# Patient Record
Sex: Female | Born: 2000 | Race: Black or African American | Hispanic: No | Marital: Married | Smoking: Never smoker
Health system: Southern US, Community
[De-identification: ages and names within clinical notes are randomized; demographics above are authoritative.]

## PROBLEM LIST (undated history)

## (undated) DIAGNOSIS — Z789 Other specified health status: Secondary | ICD-10-CM

## (undated) HISTORY — DX: Other specified health status: Z78.9

---
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
## 2021-04-06 ENCOUNTER — Ambulatory Visit (INDEPENDENT_AMBULATORY_CARE_PROVIDER_SITE_OTHER): Payer: Medicaid Other | Admitting: Pediatrics

## 2021-04-06 ENCOUNTER — Other Ambulatory Visit (HOSPITAL_COMMUNITY)
Admission: RE | Admit: 2021-04-06 | Discharge: 2021-04-06 | Disposition: A | Discharge status: Home / Self Care | Payer: Medicaid Other | Source: Ambulatory Visit | Attending: Pediatrics | Admitting: Pediatrics

## 2021-04-06 ENCOUNTER — Other Ambulatory Visit: Payer: Self-pay

## 2021-04-06 ENCOUNTER — Encounter: Payer: Self-pay | Admitting: Pediatrics

## 2021-04-06 VITALS — BP 133/81 | HR 92 | Ht 64.0 in | Wt 334.0 lb

## 2021-04-06 DIAGNOSIS — Z113 Encounter for screening for infections with a predominantly sexual mode of transmission: Secondary | ICD-10-CM | POA: Diagnosis present

## 2021-04-06 DIAGNOSIS — Z975 Presence of (intrauterine) contraceptive device: Secondary | ICD-10-CM | POA: Insufficient documentation

## 2021-04-06 DIAGNOSIS — Z114 Encounter for screening for human immunodeficiency virus [HIV]: Secondary | ICD-10-CM | POA: Diagnosis not present

## 2021-04-06 DIAGNOSIS — N926 Irregular menstruation, unspecified: Secondary | ICD-10-CM | POA: Diagnosis not present

## 2021-04-06 DIAGNOSIS — Z3202 Encounter for pregnancy test, result negative: Secondary | ICD-10-CM

## 2021-04-06 DIAGNOSIS — Z7251 High risk heterosexual behavior: Secondary | ICD-10-CM | POA: Diagnosis not present

## 2021-04-06 DIAGNOSIS — Z30017 Encounter for initial prescription of implantable subdermal contraceptive: Secondary | ICD-10-CM

## 2021-04-06 LAB — POCT RAPID HIV: Rapid HIV, POC: NEGATIVE

## 2021-04-06 LAB — POCT URINE PREGNANCY: Preg Test, Ur: NEGATIVE

## 2021-04-06 MED ORDER — ETONOGESTREL 68 MG ~~LOC~~ IMPL
68.0000 mg | DRUG_IMPLANT | Freq: Once | SUBCUTANEOUS | Status: AC
  Administered 2021-04-06: 68 mg via SUBCUTANEOUS

## 2021-04-06 MED ORDER — LEVONORGESTREL 1.5 MG PO TABS
1.5000 mg | ORAL_TABLET | Freq: Once | ORAL | Status: AC
  Administered 2021-04-06: 1.5 mg via ORAL

## 2021-04-06 NOTE — Patient Instructions (Signed)
° °  Congratulations on getting your Nexplanon placement!  Below is some important information about Nexplanon. ° °First remember that Nexplanon does not prevent sexually transmitted infections.  Condoms will help prevent sexually transmitted infections. °The Nexplanon starts working 7 days after it was inserted.  There is a risk of getting pregnant if you have unprotected sex in those first 7 days after placement of the Nexplanon. ° °The Nexplanon lasts for 3 years but can be removed at any time.  You can become pregnant as early as 1 week after removal.  You can have a new Nexplanon put in after the old one is removed if you like. ° °It is not known whether Nexplanon is as effective in women who are very overweight because the studies did not include many overweight women. ° °Nexplanon interacts with some medications, including barbiturates, bosentan, carbamazepine, felbamate, griseofulvin, oxcarbazepine, phenytoin, rifampin, St. John's wort, topiramate, HIV medicines.  Please alert your doctor if you are on any of these medicines. ° °Always tell other healthcare providers that you have a Nexplanon in your arm. ° °The Nexplanon was placed just under the skin.  Leave the outside bandage on for 24 hours.  Leave the smaller bandage on for 3-5 days or until it falls off on its own.  Keep the area clean and dry for 3-5 days. °There is usually bruising or swelling at the insertion site for a few days to a week after placement.  If you see redness or pus draining from the insertion site, call us immediately. ° °Keep your user card with the date the implant was placed and the date the implant is to be removed. ° °The most common side effect is a change in your menstrual bleeding pattern.   This bleeding is generally not harmful to you but can be annoying.  Call or come in to see us if you have any concerns about the bleeding or if you have any side effects or questions.   ° °We will call you in 1 week to check in and we  would like you to return to the clinic for a follow-up visit in 1 month. ° °You can call [facility] 24 hours a day with any questions or concerns.  There is always a nurse or doctor available to take your call.  Call 9-1-1 if you have a life-threatening emergency.  For anything else, please call us at [phone] before heading to the ER. °

## 2021-04-06 NOTE — Progress Notes (Signed)
[phone] confidential number

## 2021-04-06 NOTE — Progress Notes (Signed)
THIS RECORD MAY CONTAIN CONFIDENTIAL INFORMATION THAT SHOULD NOT BE RELEASED WITHOUT REVIEW OF THE SERVICE PROVIDER.  Adolescent Medicine Consultation Initial Visit Krista Carroll  is a 20 y.o. female referred by Pediatricians, Rolland Bimler* here today for evaluation of contraception options.    Supervising Physician: Dr. Delorse Lek    Review of records?  yes  Pertinent Labs? No  Growth Chart Viewed? yes   History was provided by the patient and mother.    Chief complaint: wants nexplanon  HPI:   PCP Confirmed?  yes   Referred by: Dr. Abran Cantor  At [employer] studying radiology. Wants to go to [employer]. Working on getting a job. Was livng in [facility] 7 months.   Menarche at 43. They have never been regular. Sometimes they were for a few years. When she started OCP her periods stopped. After stopping OCP in May she has had one period since then. Lasted about 4-5 days. Some cramping but not awful. Has never missed period more than 3 months but has had more than one ina month. Has had one cycle that lasted about 3-4 weeks. Occasional headaches. LMP she can't remember.   Mom with irregular cycles when she was in a larger body. No infertility. Mom used depo which didn't work well- gained a lot of weight and still got pregnant. Mom with HTN. No DM history.   Lives at home with mom and sister. Sister is 63- possibly not having regular cycles.   Unprotected sex 2 days ago- agreeable to [facility] here today. With the same female partner. Feels safe in relationship. No concerns today.   No LMP recorded.  No Known Allergies Current Outpatient Medications on File Prior to Visit  Medication Sig Dispense Refill   etonogestrel (NEXPLANON) 68 MG IMPL implant 1 each (68 mg total) by Subdermal route once. 1 each 0   cyclobenzaprine (FLEXERIL) 5 MG tablet Take 1-2 tablets (5-10 mg total) by mouth 2 (two) times daily as needed for muscle spasms. (Patient not taking: Reported on 04/06/2021) 24 tablet 0   naproxen  (NAPROSYN) 500 MG tablet Take 1 tablet (500 mg total) by mouth 2 (two) times daily. (Patient not taking: Reported on 04/06/2021) 30 tablet 0   No current facility-administered medications on file prior to visit.    Patient Active Problem List   Diagnosis Date Noted   Irregular menses 04/06/2021    Past Medical History:  Reviewed and updated?  yes Past Medical History:  Diagnosis Date   Medical history non-contributory     Family History: Reviewed and updated? yes Family History  Problem Relation Age of Onset   Obesity Mother    Hypertension Mother    Menstrual problems Mother    Menstrual problems Sister    Obesity Sister     Social History:  Activities:  Special interests/hobbies/sports: likes to write and go hiking, swimming, painting   Lifestyle habits that can impact QOL: Sleep:pretty good Eating habits/patterns: about 2 meals a day  Water intake: could do better  Exercise: hiking   Confidentiality was discussed with the patient and if applicable, with caregiver as well.  Gender identity: female Sex assigned at birth: female Pronouns: she Tobacco?  no Partner preference?  female  Sexually Active?  yes  Pregnancy Prevention:  condoms Reviewed condoms:  yes Reviewed EC:  yes    Trusted adult at home/school:  yes Feels safe at home:  yes Trusted friends:  yes Feels safe at school:  yes  The following portions of the  patient's history were reviewed and updated as appropriate: allergies, current medications, past family history, past medical history, past social history, past surgical history, and problem list.  Physical Exam:  Vitals:   04/06/21 1412  BP: 133/81  Pulse: 92  Weight: (!) 334 lb (151.5 kg)  Height: 5\' 4"  (1.626 m)   BP 133/81   Pulse 92   Ht 5\' 4"  (1.626 m)   Wt (!) 334 lb (151.5 kg)   BMI 57.33 kg/m  Body mass index: body mass index is 57.33 kg/m. Blood pressure percentiles are not available for patients who are 18 years or  older.   Physical Exam Vitals and nursing note reviewed.  Constitutional:      General: She is not in acute distress.    Appearance: She is well-developed. She is obese.  Neck:     Thyroid: No thyromegaly.  Cardiovascular:     Rate and Rhythm: Normal rate and regular rhythm.     Heart sounds: No murmur heard. Pulmonary:     Breath sounds: Normal breath sounds.  Abdominal:     Palpations: Abdomen is soft. There is no mass.     Tenderness: There is no abdominal tenderness. There is no guarding.  Musculoskeletal:     Right lower leg: No edema.     Left lower leg: No edema.  Lymphadenopathy:     Cervical: No cervical adenopathy.  Skin:    General: Skin is warm.     Capillary Refill: Capillary refill takes less than 2 seconds.     Findings: No rash.     Comments: Acanthosis  Neurological:     Mental Status: She is alert.     Comments: No tremor     Assessment/Plan: 1. Insertion of Nexplanon Pt desires nexplanon. Has been on OCP in the past but didn't like it. Discussed r/b/se and expectations. Has a friend who has had a good experience. Placed without difficulty today.  - etonogestrel (NEXPLANON) 68 MG IMPL implant; 1 each (68 mg total) by Subdermal route once.  Dispense: 1 each; Refill: 0 - etonogestrel (NEXPLANON) implant 68 mg - Subdermal Etonogestrel Implant Insertion  2. Irregular menses Irregular menstrual cycles. Urine preg negative today. No other overt signs of PCOS, however, will get labs today since we are starting hormones. She is in agreement.  - CBC - Comprehensive metabolic panel - DHEA-sulfate - Follicle stimulating hormone - Luteinizing hormone - Prolactin - Testos,Total,Free and SHBG (Female) - CBC with Differential/Platelet - Lipid panel - Hemoglobin A1c - TSH - T4, free  3. Unprotected sex Plan B today- discussed nexplanon not effective for 7 days. Will repeat urine preg here in 1 month.  - levonorgestrel (PLAN B 1-STEP) tablet 1.5 mg  4.  Routine screening for STI (sexually transmitted infection) Per protocol.  - POCT Rapid HIV - Urine cytology ancillary only  5. Pregnancy examination or test, negative result Neg despite missed period.  - POCT urine pregnancy   Follow-up:   Return in about 4 weeks (around 05/04/2021).   Medical decision-making:  >40 minutes spent face to face with patient with more than 50% of appointment spent discussing diagnosis, management, follow-up, and reviewing of contraception, irreugular menses.  A copy of this consultation visit was sent to: Pediatricians, [place], Pediatricians, *

## 2021-04-06 NOTE — Procedures (Signed)
Nexplanon Insertion  No contraindications for placement.  No liver disease, no unexplained vaginal bleeding, no h/o breast cancer, no h/o blood clots.  No LMP recorded.  UHCG: neg  Last Unprotected sex:  two days ago- plan b given  Risks & benefits of Nexplanon discussed The nexplanon device was purchased and supplied by [facility]. Packaging instructions supplied to patient Consent form signed  The patient denies any allergies to anesthetics or antiseptics.  Procedure: Pt was placed in supine position. The left arm was flexed at the elbow and externally rotated so that her wrist was parallel to her ear The medial epicondyle of the left arm was identified The insertions site was marked 8 cm proximal to the medial epicondyle The insertion site was cleaned with Betadine The area surrounding the insertion site was covered with a sterile drape 1% lidocaine was injected just under the skin at the insertion site extending 4 cm proximally. The sterile preloaded disposable Nexaplanon applicator was removed from the sterile packaging The applicator needle was inserted at a 30 degree angle at 8 cm proximal to the medial epicondyle as marked The applicator was lowered to a horizontal position and advanced just under the skin for the full length of the needle The slider on the applicator was retracted fully while the applicator remained in the same position, then the applicator was removed. The implant was confirmed via palpation as being in position The implant position was demonstrated to the patient Pressure dressing was applied to the patient.  The patient was instructed to removed the pressure dressing in 24 hrs.  The patient was advised to move slowly from a supine to an upright position  The patient denied any concerns or complaints  The patient was instructed to schedule a follow-up appt in 1 month and to call sooner if any concerns.  The patient acknowledged agreement and  understanding of the plan.

## 2021-04-07 LAB — URINE CYTOLOGY ANCILLARY ONLY
Chlamydia: NEGATIVE
Comment: NEGATIVE
Comment: NORMAL
Neisseria Gonorrhea: NEGATIVE

## 2021-04-10 LAB — CBC WITH DIFFERENTIAL/PLATELET
Absolute Monocytes: 570 cells/uL (ref 200–900)
Basophils Absolute: 37 cells/uL (ref 0–200)
Basophils Relative: 0.3 %
Eosinophils Absolute: 211 cells/uL (ref 15–500)
Eosinophils Relative: 1.7 %
HCT: 37.4 % (ref 34.0–46.0)
Hemoglobin: 12 g/dL (ref 11.5–15.3)
Lymphs Abs: 2889 cells/uL (ref 1200–5200)
MCH: 25.6 pg (ref 25.0–35.0)
MCHC: 32.1 g/dL (ref 31.0–36.0)
MCV: 79.7 fL (ref 78.0–98.0)
MPV: 9.9 fL (ref 7.5–12.5)
Monocytes Relative: 4.6 %
Neutro Abs: 8692 cells/uL — ABNORMAL HIGH (ref 1800–8000)
Neutrophils Relative %: 70.1 %
Platelets: 448 10*3/uL — ABNORMAL HIGH (ref 140–400)
RBC: 4.69 10*6/uL (ref 3.80–5.10)
RDW: 14.8 % (ref 11.0–15.0)
Total Lymphocyte: 23.3 %
WBC: 12.4 10*3/uL (ref 4.5–13.0)

## 2021-04-10 LAB — LIPID PANEL
Cholesterol: 167 mg/dL (ref <170)
HDL: 44 mg/dL — ABNORMAL LOW (ref >45)
LDL Cholesterol (Calc): 100 mg/dL (calc) (ref <110)
Non-HDL Cholesterol (Calc): 123 mg/dL (calc) — ABNORMAL HIGH (ref <120)
Total CHOL/HDL Ratio: 3.8 (calc) (ref <5.0)
Triglycerides: 131 mg/dL — ABNORMAL HIGH (ref <90)

## 2021-04-10 LAB — HEMOGLOBIN A1C
Hgb A1c MFr Bld: 5.9 % of total Hgb — ABNORMAL HIGH (ref <5.7)
Mean Plasma Glucose: 123 mg/dL
eAG (mmol/L): 6.8 mmol/L

## 2021-04-10 LAB — COMPREHENSIVE METABOLIC PANEL
AG Ratio: 1.3 (calc) (ref 1.0–2.5)
ALT: 18 U/L (ref 5–32)
AST: 12 U/L (ref 12–32)
Albumin: 4 g/dL (ref 3.6–5.1)
Alkaline phosphatase (APISO): 71 U/L (ref 36–128)
BUN: 9 mg/dL (ref 7–20)
CO2: 26 mmol/L (ref 20–32)
Calcium: 9.5 mg/dL (ref 8.9–10.4)
Chloride: 102 mmol/L (ref 98–110)
Creat: 0.63 mg/dL (ref 0.50–0.96)
Globulin: 3.2 g/dL (calc) (ref 2.0–3.8)
Glucose, Bld: 89 mg/dL (ref 65–99)
Potassium: 4.3 mmol/L (ref 3.8–5.1)
Sodium: 136 mmol/L (ref 135–146)
Total Bilirubin: 0.3 mg/dL (ref 0.2–1.1)
Total Protein: 7.2 g/dL (ref 6.3–8.2)

## 2021-04-10 LAB — TESTOS,TOTAL,FREE AND SHBG (FEMALE)
Free Testosterone: 5.7 pg/mL (ref 0.1–6.4)
Sex Hormone Binding: 35 nmol/L (ref 17–124)
Testosterone, Total, LC-MS-MS: 43 ng/dL (ref 2–45)

## 2021-04-10 LAB — T4, FREE: Free T4: 1.1 ng/dL (ref 0.8–1.4)

## 2021-04-10 LAB — LUTEINIZING HORMONE: LH: 4.4 m[IU]/mL

## 2021-04-10 LAB — TSH: TSH: 2.97 mIU/L

## 2021-04-10 LAB — FOLLICLE STIMULATING HORMONE: FSH: 1.8 m[IU]/mL

## 2021-04-10 LAB — PROLACTIN: Prolactin: 20.1 ng/mL — ABNORMAL HIGH

## 2021-04-10 LAB — DHEA-SULFATE: DHEA-SO4: 321 ug/dL — ABNORMAL HIGH (ref 44–286)

## 2021-04-13 ENCOUNTER — Telehealth: Payer: Self-pay

## 2021-04-13 ENCOUNTER — Other Ambulatory Visit: Payer: Self-pay | Admitting: Pediatrics

## 2021-04-13 DIAGNOSIS — R7303 Prediabetes: Secondary | ICD-10-CM

## 2021-04-13 DIAGNOSIS — E282 Polycystic ovarian syndrome: Secondary | ICD-10-CM

## 2021-04-13 NOTE — Telephone Encounter (Signed)
Pt would like a cal back with lab results

## 2021-04-20 ENCOUNTER — Other Ambulatory Visit: Payer: Self-pay | Admitting: Pediatrics

## 2021-04-20 MED ORDER — METFORMIN HCL ER 500 MG PO TB24: ORAL_TABLET | ORAL | 3 refills | Status: DC

## 2021-04-27 ENCOUNTER — Ambulatory Visit: Payer: Medicaid Other

## 2021-04-28 ENCOUNTER — Ambulatory Visit: Payer: Medicaid Other | Admitting: Pediatrics

## 2021-04-29 ENCOUNTER — Encounter: Payer: No Typology Code available for payment source | Attending: Pediatrics | Admitting: Registered"

## 2021-04-30 ENCOUNTER — Ambulatory Visit: Payer: Medicaid Other | Admitting: Pediatrics

## 2021-05-07 ENCOUNTER — Encounter: Payer: Self-pay | Admitting: Physician Assistant

## 2021-05-07 ENCOUNTER — Telehealth: Payer: Medicaid Other

## 2021-05-20 ENCOUNTER — Telehealth: Payer: Medicaid Other | Admitting: Physician Assistant

## 2021-05-20 ENCOUNTER — Ambulatory Visit: Payer: Medicaid Other | Admitting: Registered"

## 2021-05-20 DIAGNOSIS — N939 Abnormal uterine and vaginal bleeding, unspecified: Secondary | ICD-10-CM

## 2021-05-20 NOTE — Progress Notes (Signed)
Virtual Visit Consent   Krista Carroll, you are scheduled for a virtual visit with a [facility] provider today.     Just as with appointments in the office, your consent must be obtained to participate.  Your consent will be active for this visit and any virtual visit you may have with one of our providers in the next 365 days.     If you have a MyChart account, a copy of this consent can be sent to you electronically.  All virtual visits are billed to your insurance company just like a traditional visit in the office.    As this is a virtual visit, video technology does not allow for your provider to perform a traditional examination.  This may limit your provider's ability to fully assess your condition.  If your provider identifies any concerns that need to be evaluated in person or the need to arrange testing (such as labs, EKG, etc.), we will make arrangements to do so.     Although advances in technology are sophisticated, we cannot ensure that it will always work on either your end or our end.  If the connection with a video visit is poor, the visit may have to be switched to a telephone visit.  With either a video or telephone visit, we are not always able to ensure that we have a secure connection.     I need to obtain your verbal consent now.   Are you willing to proceed with your visit today?    Krista Carroll has provided verbal consent on 05/20/2021 for a virtual visit (video or telephone).   Piedad Climes, [place]   Date: 05/20/2021 2:01 PM   Virtual Visit via Video Note   I, Piedad Climes, connected with  Krista Carroll  ([id number], 08-26-2001) on 05/20/21 at  1:45 PM EDT by a video-enabled telemedicine application and verified that I am speaking with the correct person using two identifiers.  Location: Patient: Virtual Visit Location Patient: Home Provider: Virtual Visit Location Provider: Home Office   I discussed the limitations of evaluation and management by  telemedicine and the availability of in person appointments. The patient expressed understanding and agreed to proceed.    History of Present Illness: Krista Carroll is a 20 y.o. who identifies as a female who was assigned female at birth, and is being seen today for questions about her menstrual period. Has history of irregular periods and some heavier flow on occasion. Has been followed by her PCP regarding this and was put on Metformin for suspected PCOS (she is no longer taking) and Nexplanon placed to help with periods and for contraception. This was placed in July. She notes before then she had not had a period since April. She did have a period early august lasting for a few days and seemed like a "normal" period for her. Notes over the past 1.5 days having some mild vaginal spotting but that is darker in color that what she is used to -- slightly more brown. Denies fever, chills, malaise or fatigue. Does occasionally get pelvic cramping but none recently. Denies any change to bowel or bladder habits. Is sexually active with one monogamous female partner. No protection. Is not concerned for STI.  HPI: HPI  Problems:  Patient Active Problem List   Diagnosis Date Noted   Irregular menses 04/06/2021   Nexplanon in place 04/06/2021    Allergies: No Known Allergies Medications:  Current Outpatient Medications:    etonogestrel (NEXPLANON) 68  MG IMPL implant, 1 each (68 mg total) by Subdermal route once., Disp: 1 each, Rfl: 0   metFORMIN (GLUCOPHAGE XR) 500 MG 24 hr tablet, Take 1 tablet (500 mg total) by mouth daily with breakfast for 7 days, THEN 2 tablets (1,000 mg total) daily with breakfast for 7 days, THEN 3 tablets (1,500 mg total) daily with breakfast for 14 days., Disp: 90 tablet, Rfl: 3  Observations/Objective: Patient is well-developed, well-nourished in no acute distress.  Resting comfortably at home.  Head is normocephalic, atraumatic.  No labored breathing. Speech is clear and  coherent with logical content.  Patient is alert and oriented at baseline.   Assessment and Plan: 1. Vaginal spotting <48 hours. Mild but slightly different in coloration. Discussed this could be related to hormonal changes from Nexplanon, etc. No other accompanying symptoms. Reassurance given. She is to monitor for onset of her full period. If this is not occurring and resolving in her usual time frame or if she just continues to spot without stopping, she needs to follow-up with her primary care provider. Giving recent placement of Nexplanon, sexual activity and her concerns, recommended a home pregnancy test to ease her mind.   Follow Up Instructions: I discussed the assessment and treatment plan with the patient. The patient was provided an opportunity to ask questions and all were answered. The patient agreed with the plan and demonstrated an understanding of the instructions.  A copy of instructions were sent to the patient via MyChart.  The patient was advised to call back or seek an in-person evaluation if the symptoms worsen or if the condition fails to improve as anticipated.  Time:  I spent 12 minutes with the patient via telehealth technology discussing the above problems/concerns.    Piedad Climes, PA-C

## 2021-05-26 ENCOUNTER — Ambulatory Visit (INDEPENDENT_AMBULATORY_CARE_PROVIDER_SITE_OTHER): Payer: Medicaid Other | Admitting: Family

## 2021-05-26 ENCOUNTER — Telehealth: Payer: Medicaid Other | Admitting: Physician Assistant

## 2021-05-26 ENCOUNTER — Encounter: Payer: Self-pay | Admitting: Family

## 2021-05-26 ENCOUNTER — Other Ambulatory Visit: Payer: Self-pay

## 2021-05-26 VITALS — BP 144/89 | HR 88 | Ht 64.0 in | Wt 330.2 lb

## 2021-05-26 DIAGNOSIS — N926 Irregular menstruation, unspecified: Secondary | ICD-10-CM | POA: Diagnosis not present

## 2021-05-26 DIAGNOSIS — E282 Polycystic ovarian syndrome: Secondary | ICD-10-CM

## 2021-05-26 DIAGNOSIS — N921 Excessive and frequent menstruation with irregular cycle: Secondary | ICD-10-CM

## 2021-05-26 DIAGNOSIS — Z113 Encounter for screening for infections with a predominantly sexual mode of transmission: Secondary | ICD-10-CM

## 2021-05-26 DIAGNOSIS — R102 Pelvic and perineal pain: Secondary | ICD-10-CM | POA: Diagnosis not present

## 2021-05-26 DIAGNOSIS — Z975 Presence of (intrauterine) contraceptive device: Secondary | ICD-10-CM

## 2021-05-26 LAB — POCT URINALYSIS DIPSTICK
Bilirubin, UA: NEGATIVE
Blood, UA: POSITIVE
Glucose, UA: NEGATIVE
Ketones, UA: NEGATIVE
Leukocytes, UA: NEGATIVE
Nitrite, UA: NEGATIVE
Protein, UA: POSITIVE — AB
Spec Grav, UA: 1.015 (ref 1.010–1.025)
Urobilinogen, UA: NEGATIVE E.U./dL — AB
pH, UA: 6.5 (ref 5.0–8.0)

## 2021-05-26 NOTE — Patient Instructions (Signed)
  Krista Carroll, thank you for joining Piedad Climes, PA-C for today's virtual visit.  While this provider is not your primary care provider (PCP), if your PCP is located in our provider database this encounter information will be shared with them immediately following your visit.  Consent: (Patient) Krista Carroll provided verbal consent for this virtual visit at the beginning of the encounter.  Current Medications:  Current Outpatient Medications:    etonogestrel (NEXPLANON) 68 MG IMPL implant, 1 each (68 mg total) by Subdermal route once., Disp: 1 each, Rfl: 0   metFORMIN (GLUCOPHAGE XR) 500 MG 24 hr tablet, Take 1 tablet (500 mg total) by mouth daily with breakfast for 7 days, THEN 2 tablets (1,000 mg total) daily with breakfast for 7 days, THEN 3 tablets (1,500 mg total) daily with breakfast for 14 days., Disp: 90 tablet, Rfl: 3   Medications ordered in this encounter:  No orders of the defined types were placed in this encounter.    *If you need refills on other medications prior to your next appointment, please contact your pharmacy*  Follow-Up: Call back or seek an in-person evaluation if the symptoms worsen or if the condition fails to improve as anticipated.  Other Instructions As discussed, please call your primary care office this morning to make them aware and to see if they can get you in for evaluation this AM. IF they are not able to get you in, you need evaluation at Urgent Care. ER for any worsening of symptoms.   If you have been instructed to have an in-person evaluation today at a local Urgent Care facility, please use the link below. It will take you to a list of all of our available [facility], including address, phone number and hours of operation. Please do not delay care.  [facility]  If you or a family member do not have a primary care provider, use the link below to schedule a visit and establish care. When you choose a [facility] primary care physician or advanced practice provider, you gain a long-term partner in health. Find a Primary Care Provider  Learn more about [facility]'s in-office and virtual care options: [facility] - Get Care Now

## 2021-05-26 NOTE — Progress Notes (Signed)
Repeat blood pressure not obtained- pt left before measuring.

## 2021-05-26 NOTE — Progress Notes (Signed)
Virtual Visit Consent   Krista Carroll, you are scheduled for a virtual visit with a [facility] provider today.     Just as with appointments in the office, your consent must be obtained to participate.  Your consent will be active for this visit and any virtual visit you may have with one of our providers in the next 365 days.     If you have a MyChart account, a copy of this consent can be sent to you electronically.  All virtual visits are billed to your insurance company just like a traditional visit in the office.    As this is a virtual visit, video technology does not allow for your provider to perform a traditional examination.  This may limit your provider's ability to fully assess your condition.  If your provider identifies any concerns that need to be evaluated in person or the need to arrange testing (such as labs, EKG, etc.), we will make arrangements to do so.     Although advances in technology are sophisticated, we cannot ensure that it will always work on either your end or our end.  If the connection with a video visit is poor, the visit may have to be switched to a telephone visit.  With either a video or telephone visit, we are not always able to ensure that we have a secure connection.     I need to obtain your verbal consent now.   Are you willing to proceed with your visit today?    Krista Carroll has provided verbal consent on 05/26/2021 for a virtual visit (video or telephone).   Piedad Climes, [place]   Date: 05/26/2021 7:54 AM   Virtual Visit via Video Note   I, Piedad Climes, connected with  Krista Carroll  ([id number], Feb 07, 2001) on 05/26/21 at  7:45 AM EDT by a video-enabled telemedicine application and verified that I am speaking with the correct person using two identifiers.  Location: Patient: Virtual Visit Location Patient: Home Provider: Virtual Visit Location Provider: Home Office   I discussed the limitations of evaluation and management by  telemedicine and the availability of in person appointments. The patient expressed understanding and agreed to proceed.    History of Present Illness: Krista Carroll is a 20 y.o. who identifies as a female who was assigned female at birth, and is being seen today for ongoing symptoms.Was seen last week by this provider for a couple of days of suspected spotting. Continued spotting from vagina but now notes is more discharge like and now associated with pelvic pain that has become significant. Spotting was brown, then red, then back to brown. Denies fever, chills. Notes some fatigue and headache along with the pain. Has had nausea with this without vomiting.   HPI: HPI  Problems:  Patient Active Problem List   Diagnosis Date Noted   Irregular menses 04/06/2021   Nexplanon in place 04/06/2021    Allergies: No Known Allergies Medications:  Current Outpatient Medications:    etonogestrel (NEXPLANON) 68 MG IMPL implant, 1 each (68 mg total) by Subdermal route once., Disp: 1 each, Rfl: 0   metFORMIN (GLUCOPHAGE XR) 500 MG 24 hr tablet, Take 1 tablet (500 mg total) by mouth daily with breakfast for 7 days, THEN 2 tablets (1,000 mg total) daily with breakfast for 7 days, THEN 3 tablets (1,500 mg total) daily with breakfast for 14 days., Disp: 90 tablet, Rfl: 3  Observations/Objective: Patient is well-developed, well-nourished in no acute distress.  Resting  comfortably at home.  Head is normocephalic, atraumatic.  No labored breathing. Speech is clear and coherent with logical content.  Patient is alert and oriented at baseline.   Assessment and Plan: 1. Pelvic pain in female Associated with continued spotting versus discharge. She needs further assessment that what can be given via this visit type. She is stable at present. She was instructed to contact her PCP office when they open at 8 to make them aware and to get appointment for evaluation. If unable to be seen, she is to be seen at a [facility] UC/ER.   Follow Up Instructions: I discussed the assessment and treatment plan with the patient. The patient was provided an opportunity to ask questions and all were answered. The patient agreed with the plan and demonstrated an understanding of the instructions.  A copy of instructions were sent to the patient via MyChart.  The patient was advised to call back or seek an in-person evaluation if the symptoms worsen or if the condition fails to improve as anticipated.  Time:  I spent 8 minutes with the patient via telehealth technology discussing the above problems/concerns.    Piedad Climes, PA-C

## 2021-05-26 NOTE — Progress Notes (Signed)
History was provided by the patient.  Krista Carroll is a 20 y.o. female who is here for vaginal discharge.   PCP confirmed? Yes.    Pediatricians, [place]  HPI:   -two recent video visits for vaginal spotting and pelvic pain  -had nexplanon placed in July for contraception with no period since April until she bled in August for several days like a normal period; now having some brown discharge/brown blood. -no pain with intercourse; one female partner -took metformin for PCOS but had terrible upset stomach with it so stopped it  -no headaches, no vision changes; TSH/Free T4 were normal in July.    Patient Active Problem List   Diagnosis Date Noted   Irregular menses 04/06/2021   Nexplanon in place 04/06/2021    Current Outpatient Medications on File Prior to Visit  Medication Sig Dispense Refill   etonogestrel (NEXPLANON) 68 MG IMPL implant 1 each (68 mg total) by Subdermal route once. 1 each 0   metFORMIN (GLUCOPHAGE XR) 500 MG 24 hr tablet Take 1 tablet (500 mg total) by mouth daily with breakfast for 7 days, THEN 2 tablets (1,000 mg total) daily with breakfast for 7 days, THEN 3 tablets (1,500 mg total) daily with breakfast for 14 days. 90 tablet 3   No current facility-administered medications on file prior to visit.    No Known Allergies  Physical Exam:    Vitals:   05/26/21 1611  BP: (!) 144/89  Pulse: 88  Weight: (!) 330 lb 3.2 oz (149.8 kg)  Height: 5\' 4"  (1.626 m)    Blood pressure percentiles are not available for patients who are 18 years or older. No LMP recorded.  Physical Exam Vitals reviewed.  Constitutional:      General: She is not in acute distress.    Appearance: Normal appearance.  HENT:     Head: Normocephalic.     Mouth/Throat:     Pharynx: Oropharynx is clear.  Eyes:     General: No scleral icterus.    Extraocular Movements: Extraocular movements intact.     Pupils: Pupils are equal, round, and reactive to light.  Cardiovascular:      Rate and Rhythm: Normal rate and regular rhythm.     Heart sounds: No murmur heard. Pulmonary:     Effort: Pulmonary effort is normal.  Abdominal:     General: There is no distension.     Tenderness: There is no abdominal tenderness. There is no guarding.  Musculoskeletal:        General: No swelling. Normal range of motion.     Cervical back: Normal range of motion and neck supple. No rigidity.  Skin:    General: Skin is warm and dry.  Neurological:     General: No focal deficit present.     Mental Status: She is alert and oriented to person, place, and time.  Psychiatric:        Mood and Affect: Mood normal.     Assessment/Plan: 1. Breakthrough bleeding on Nexplanon 2. Irregular menses 3. PCOS (polycystic ovarian syndrome) 4. Routine screening for STI (sexually transmitted infection) -will screen for infections; wet prep and urinalysis today  -had negative gc/c screening on 07/25 -discussed unpredictable bleeding with nexplanon is common side effect -discussed metformin will cause GI upset if not taken on full stomach  -she is pre-contemplative to restart med at this time  -elevated BP today; needed repeat prior to leaving but was not able to obtain before patient left  -repeat  prolactin at next lab draw; does not have associated symptoms today  - POCT Urinalysis Dipstick - WET PREP BY MOLECULAR PROBE

## 2021-05-27 ENCOUNTER — Encounter: Payer: Self-pay | Admitting: Family

## 2021-05-27 LAB — WET PREP BY MOLECULAR PROBE
Candida species: NOT DETECTED
MICRO NUMBER:: 12367906
SPECIMEN QUALITY:: ADEQUATE
Trichomonas vaginosis: NOT DETECTED

## 2021-05-28 ENCOUNTER — Other Ambulatory Visit: Payer: Self-pay | Admitting: Family

## 2021-05-28 MED ORDER — METRONIDAZOLE 500 MG PO TABS: 500.0000 mg | ORAL_TABLET | Freq: Two times a day (BID) | ORAL | 0 refills | Status: AC

## 2021-06-01 ENCOUNTER — Encounter: Payer: Self-pay | Admitting: Family

## 2021-06-08 ENCOUNTER — Telehealth: Payer: Medicaid Other | Admitting: Physician Assistant

## 2021-06-08 DIAGNOSIS — R110 Nausea: Secondary | ICD-10-CM

## 2021-06-08 DIAGNOSIS — N939 Abnormal uterine and vaginal bleeding, unspecified: Secondary | ICD-10-CM | POA: Diagnosis not present

## 2021-06-08 NOTE — Patient Instructions (Signed)
Krista Carroll, thank you for joining Margaretann Loveless, PA-C for today's virtual visit.  While this provider is not your primary care provider (PCP), if your PCP is located in our provider database this encounter information will be shared with them immediately following your visit.  Consent: (Patient) Krista Carroll provided verbal consent for this virtual visit at the beginning of the encounter.  Current Medications:  Current Outpatient Medications:    etonogestrel (NEXPLANON) 68 MG IMPL implant, 1 each (68 mg total) by Subdermal route once., Disp: 1 each, Rfl: 0   metFORMIN (GLUCOPHAGE XR) 500 MG 24 hr tablet, Take 1 tablet (500 mg total) by mouth daily with breakfast for 7 days, THEN 2 tablets (1,000 mg total) daily with breakfast for 7 days, THEN 3 tablets (1,500 mg total) daily with breakfast for 14 days., Disp: 90 tablet, Rfl: 3   Medications ordered in this encounter:  No orders of the defined types were placed in this encounter.    *If you need refills on other medications prior to your next appointment, please contact your pharmacy*  Follow-Up: Call back or seek an in-person evaluation if the symptoms worsen or if the condition fails to improve as anticipated.  Other Instructions Etonogestrel Implant What is this medication? ETONOGESTREL (et oh noe JES trel) prevents ovulation and pregnancy. It belongs to a group of medications called contraceptives. This medication is a progestin hormone. This medicine may be used for other purposes; ask your health care provider or pharmacist if you have questions. COMMON BRAND NAME(S): Implanon, Nexplanon What should I tell my care team before I take this medication? They need to know if you have any of these conditions: Abnormal vaginal bleeding Blood vessel disease or blood clots Breast, cervical, endometrial, ovarian, liver, or uterine cancer Diabetes Gallbladder disease Heart disease or recent heart attack High blood  pressure High cholesterol or triglycerides Kidney disease Liver disease Migraine headaches Seizures Stroke Tobacco smoker An unusual or allergic reaction to etonogestrel, anesthetics or antiseptics, other medications, foods, dyes, or preservatives Pregnant or trying to get pregnant Breast-feeding How should I use this medication? This device is inserted just under the skin on the inner side of your upper arm by your care team. Talk to your care team about the use of this medication in children. Special care may be needed. Overdosage: If you think you have taken too much of this medicine contact a poison control center or emergency room at once. NOTE: This medicine is only for you. Do not share this medicine with others. What if I miss a dose? This does not apply. What may interact with this medication? Do not take this medication with any of the following: Amprenavir Fosamprenavir This medication may also interact with the following: Acitretin Aprepitant Armodafinil Bexarotene Bosentan Carbamazepine Certain medications for fungal infections like fluconazole, ketoconazole, itraconazole and voriconazole Certain medications to treat hepatitis, HIV or AIDS Cyclosporine Felbamate Griseofulvin Lamotrigine Modafinil Oxcarbazepine Phenobarbital Phenytoin Primidone Rifabutin Rifampin Rifapentine St. John's wort Topiramate This list may not describe all possible interactions. Give your health care provider a list of all the medicines, herbs, non-prescription drugs, or dietary supplements you use. Also tell them if you smoke, drink alcohol, or use illegal drugs. Some items may interact with your medicine. What should I watch for while using this medication? This product does not protect you against HIV infection (AIDS) or other sexually transmitted diseases. You should be able to feel the implant by pressing your fingertips over the skin where it was  inserted. Contact your care  team if you cannot feel the implant, and use a non-hormonal birth control method (such as condoms) until your care team confirms that the implant is in place. Contact your care team if you think that the implant may have broken or become bent while in your arm. You will receive a user card from your care team after the implant is inserted. The card is a record of the location of the implant in your upper arm and when it should be removed. Keep this card with your health records. What side effects may I notice from receiving this medication? Side effects that you should report to your care team as soon as possible: Allergic reactions-skin rash, itching, hives, swelling of the face, lips, tongue, or throat Blood clot-pain, swelling, or warmth in the leg, shortness of breath, chest pain Gallbladder problems-severe stomach pain, nausea, vomiting, fever Increase in blood pressure Liver injury-right upper belly pain, loss of appetite, nausea, light-colored stool, dark yellow or brown urine, yellowing skin or eyes, unusual weakness or fatigue New or worsening migraines or headaches Pain, redness, or irritation at injection site Stroke-sudden numbness or weakness of the face, arm, or leg, trouble speaking, confusion, trouble walking, loss of balance or coordination, dizziness, severe headache, change in vision Unusual vaginal discharge, itching, or odor Worsening mood, feelings of depression Side effects that usually do not require medical attention (report to your care team if they continue or are bothersome): Breast pain or tenderness Dark patches of skin on the face or other sun-exposed areas Irregular menstrual cycles or spotting Nausea Weight gain This list may not describe all possible side effects. Call your doctor for medical advice about side effects. You may report side effects to FDA at 1-800-FDA-1088. Where should I keep my medication? This medication is given in a hospital or clinic and  will not be stored at home. NOTE: This sheet is a summary. It may not cover all possible information. If you have questions about this medicine, talk to your doctor, pharmacist, or health care provider.  2022 Elsevier/Gold Standard (2020-10-07 09:15:27)    If you have been instructed to have an in-person evaluation today at a local Urgent Care facility, please use the link below. It will take you to a list of all of our available [facility], including address, phone number and hours of operation. Please do not delay care.  [facility]  If you or a family member do not have a primary care provider, use the link below to schedule a visit and establish care. When you choose a [facility] primary care physician or advanced practice provider, you gain a long-term partner in health. Find a Primary Care Provider  Learn more about [facility] in-office and virtual care options: [facility] - Get Care Now

## 2021-06-08 NOTE — Progress Notes (Signed)
Virtual Visit Consent   [name], you are scheduled for a virtual visit with a [facility] provider today.     Just as with appointments in the office, your consent must be obtained to participate.  Your consent will be active for this visit and any virtual visit you may have with one of our providers in the next 365 days.     If you have a MyChart account, a copy of this consent can be sent to you electronically.  All virtual visits are billed to your insurance company just like a traditional visit in the office.    As this is a virtual visit, video technology does not allow for your provider to perform a traditional examination.  This may limit your provider's ability to fully assess your condition.  If your provider identifies any concerns that need to be evaluated in person or the need to arrange testing (such as labs, EKG, etc.), we will make arrangements to do so.     Although advances in technology are sophisticated, we cannot ensure that it will always work on either your end or our end.  If the connection with a video visit is poor, the visit may have to be switched to a telephone visit.  With either a video or telephone visit, we are not always able to ensure that we have a secure connection.     I need to obtain your verbal consent now.   Are you willing to proceed with your visit today?    Krista Carroll has provided verbal consent on 06/08/2021 for a virtual visit (video or telephone).   Margaretann Loveless, PA-C   Date: 06/08/2021 9:36 AM   Virtual Visit via Video Note   I, Margaretann Loveless, connected with  Krista Carroll  ([id number], Aug 20, 2001) on 06/08/21 at  9:15 AM EDT by a video-enabled telemedicine application and verified that I am speaking with the correct person using two identifiers.  Location: Patient: Virtual Visit Location Patient: Home Provider: Virtual Visit Location Provider: Home Office   I discussed the limitations of evaluation and management by  telemedicine and the availability of in person appointments. The patient expressed understanding and agreed to proceed.    History of Present Illness: Krista Carroll is a 20 y.o. who identifies as a female who was assigned female at birth, and is being seen today for vaginal spotting and nausea. Symptoms started after recent treatment for BV. She completed metronidazole over a week ago, but continues to have spotting and mild nausea. She has taken 3 at home pregnancy test that are all negative. She denies any further vaginal discharge or pelvic pain. She is sexually active.   Problems:  Patient Active Problem List   Diagnosis Date Noted   Irregular menses 04/06/2021   Nexplanon in place 04/06/2021    Allergies: No Known Allergies Medications:  Current Outpatient Medications:    etonogestrel (NEXPLANON) 68 MG IMPL implant, 1 each (68 mg total) by Subdermal route once., Disp: 1 each, Rfl: 0   metFORMIN (GLUCOPHAGE XR) 500 MG 24 hr tablet, Take 1 tablet (500 mg total) by mouth daily with breakfast for 7 days, THEN 2 tablets (1,000 mg total) daily with breakfast for 7 days, THEN 3 tablets (1,500 mg total) daily with breakfast for 14 days., Disp: 90 tablet, Rfl: 3  Observations/Objective: Patient is well-developed, well-nourished in no acute distress.  Resting comfortably at home.  Head is normocephalic, atraumatic.  No labored breathing.  Speech is clear and  coherent with logical content.  Patient is alert and oriented at baseline.    Assessment and Plan: 1. Vaginal spotting  2. Nausea  - Since symptoms have continued past a week since treated with BV, suspect these to be side effects of the nexplanon - Advised to treat symptoms as needed - She may consider removal as her partner is interested in conceiving soon - Follow up with PCP or GYN  Follow Up Instructions: I discussed the assessment and treatment plan with the patient. The patient was provided an opportunity to ask questions  and all were answered. The patient agreed with the plan and demonstrated an understanding of the instructions.  A copy of instructions were sent to the patient via MyChart unless otherwise noted below.    The patient was advised to call back or seek an in-person evaluation if the symptoms worsen or if the condition fails to improve as anticipated.  Time:  I spent 10 minutes with the patient via telehealth technology discussing the above problems/concerns.    Margaretann Loveless, PA-C

## 2021-06-22 ENCOUNTER — Ambulatory Visit: Payer: Medicaid Other | Admitting: Pediatrics

## 2021-06-25 ENCOUNTER — Ambulatory Visit: Payer: Medicaid Other | Admitting: Pediatrics

## 2021-07-03 ENCOUNTER — Ambulatory Visit: Payer: Medicaid Other | Admitting: Family

## 2021-08-17 IMAGING — DX DG ANKLE COMPLETE 3+V*L*
3 series · 3 of 3 positions shown · non-contrast
Comparison: No priors.

CLINICAL DATA: 18-year-old female with history of trauma from a
fall complaining of left ankle pain.

EXAM:
LEFT ANKLE COMPLETE - 3+ VIEW

[ankle ap]
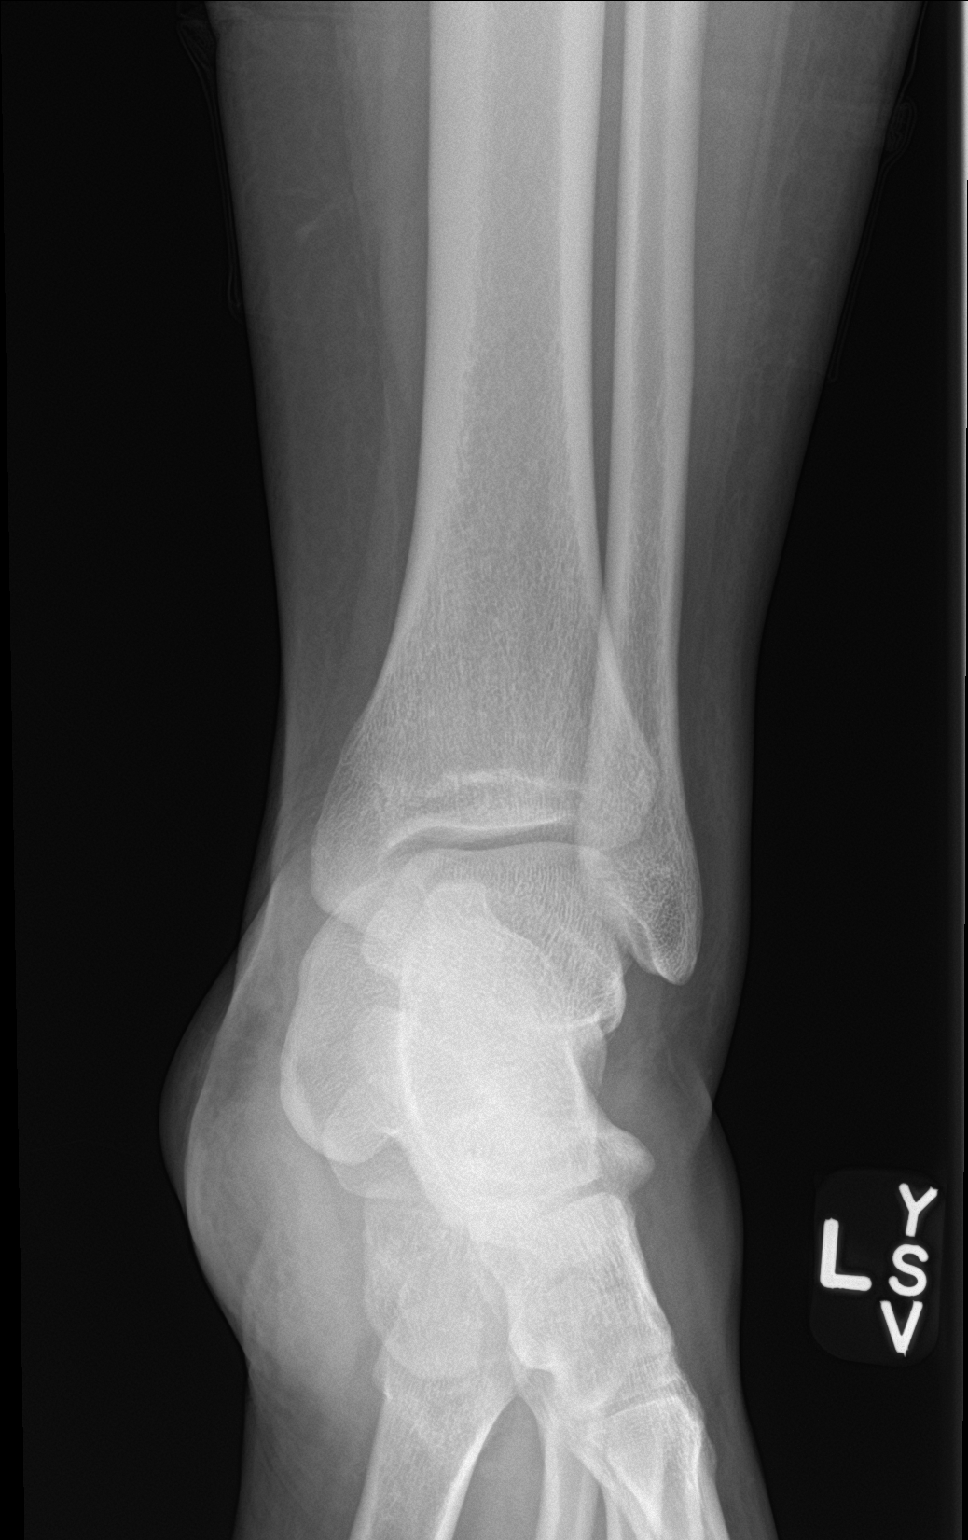

[ankle obl]
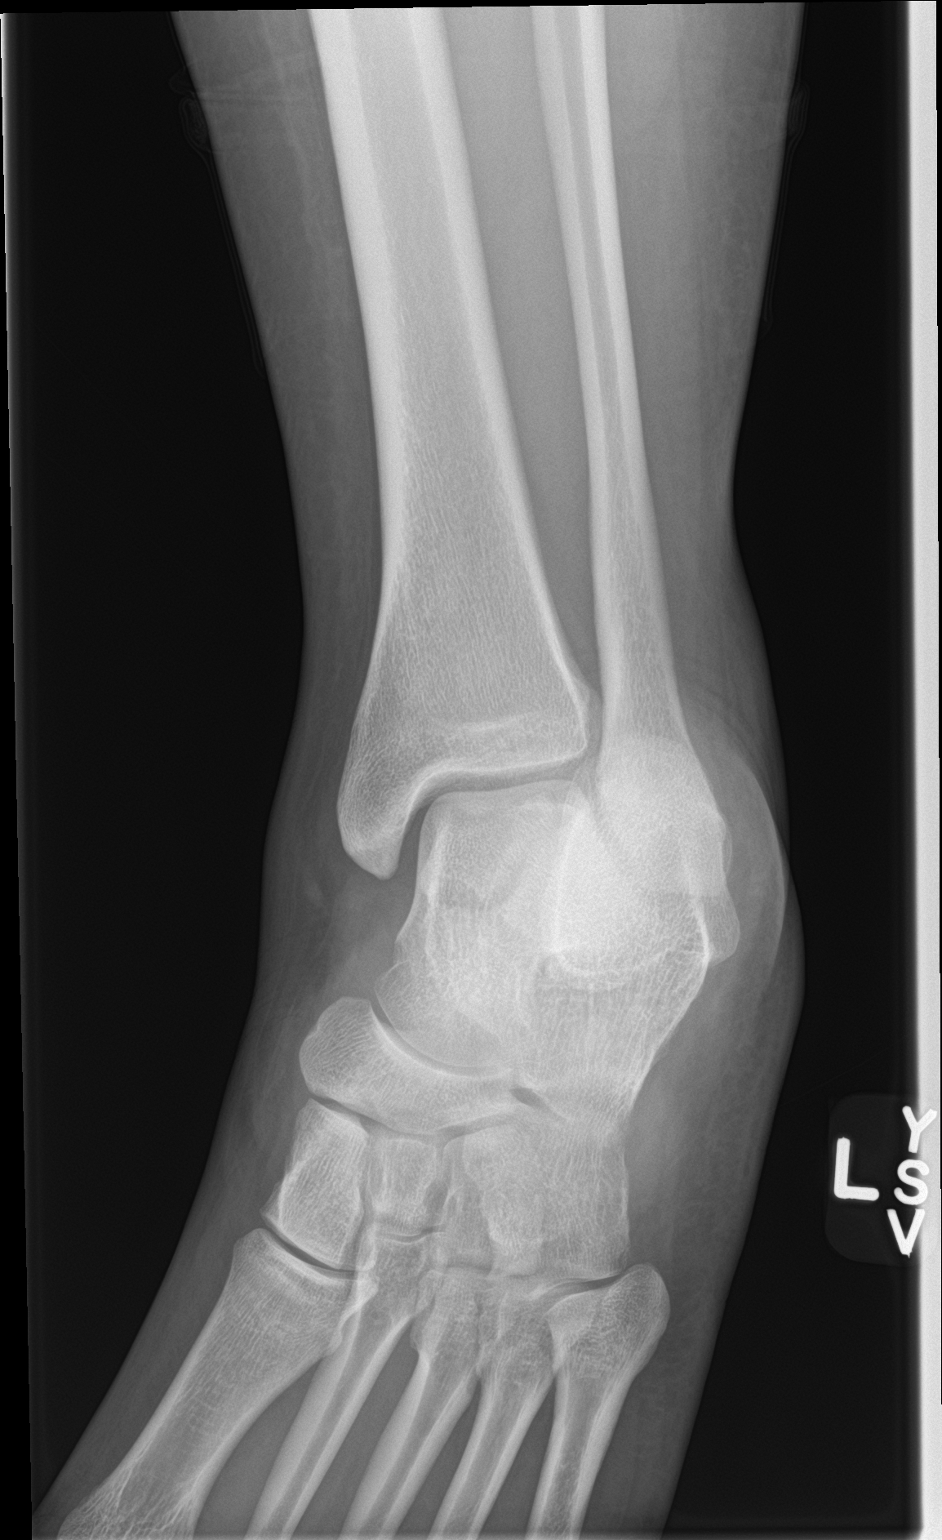

[ankle lat]
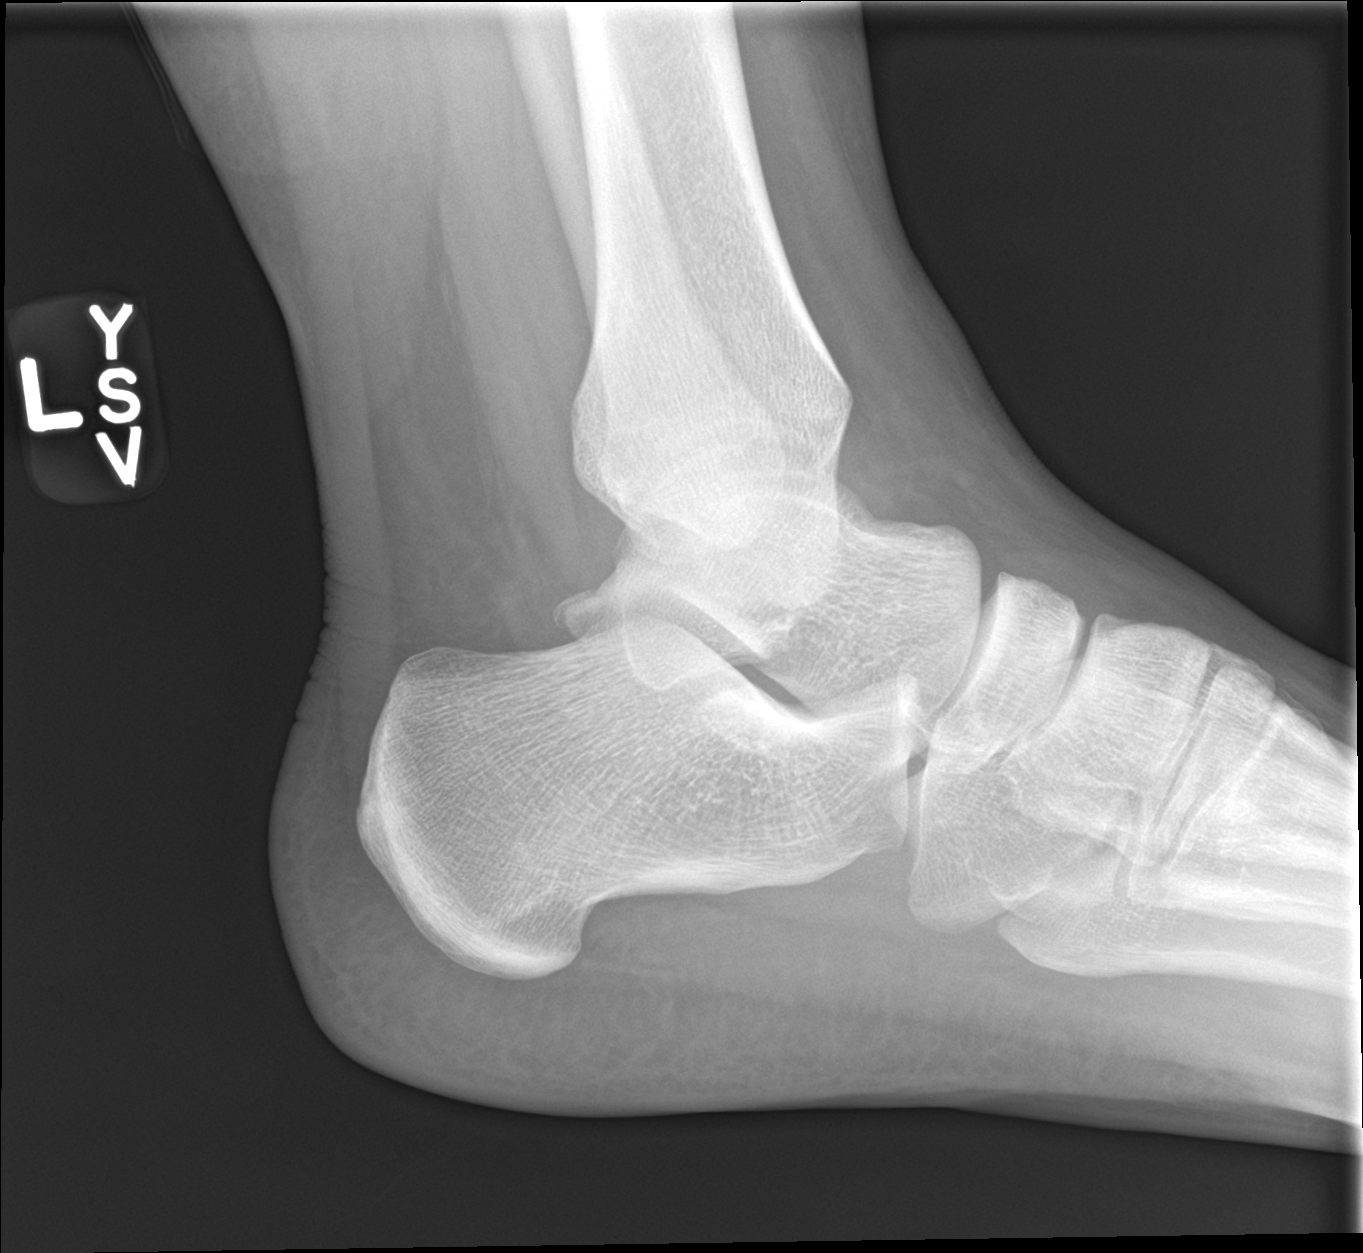

[3 of 3 positions shown; findings below may reference images not displayed]

FINDINGS: There is no evidence of fracture, dislocation, or joint effusion.
There is no evidence of arthropathy or other focal bone abnormality.
Soft tissues are unremarkable.
IMPRESSION: Negative.

## 2021-09-03 ENCOUNTER — Other Ambulatory Visit (HOSPITAL_COMMUNITY)
Admission: RE | Admit: 2021-09-03 | Discharge: 2021-09-03 | Disposition: A | Discharge status: Home / Self Care | Payer: Medicaid Other | Source: Ambulatory Visit | Attending: Pediatrics | Admitting: Pediatrics

## 2021-09-03 ENCOUNTER — Ambulatory Visit (INDEPENDENT_AMBULATORY_CARE_PROVIDER_SITE_OTHER): Payer: Medicaid Other | Admitting: Pediatrics

## 2021-09-03 ENCOUNTER — Other Ambulatory Visit: Payer: Self-pay

## 2021-09-03 ENCOUNTER — Encounter: Payer: Self-pay | Admitting: *Deleted

## 2021-09-03 ENCOUNTER — Encounter: Payer: Self-pay | Admitting: Pediatrics

## 2021-09-03 VITALS — BP 137/78 | HR 80 | Ht 64.67 in | Wt 338.6 lb

## 2021-09-03 DIAGNOSIS — E282 Polycystic ovarian syndrome: Secondary | ICD-10-CM | POA: Diagnosis not present

## 2021-09-03 DIAGNOSIS — Z3046 Encounter for surveillance of implantable subdermal contraceptive: Secondary | ICD-10-CM

## 2021-09-03 DIAGNOSIS — R7303 Prediabetes: Secondary | ICD-10-CM

## 2021-09-03 DIAGNOSIS — Z113 Encounter for screening for infections with a predominantly sexual mode of transmission: Secondary | ICD-10-CM

## 2021-09-03 MED ORDER — NORETHIN ACE-ETH ESTRAD-FE 1.5-30 MG-MCG PO TABS: 1.0000 | ORAL_TABLET | Freq: Every day | ORAL | 3 refills | Status: DC

## 2021-09-03 NOTE — Patient Instructions (Signed)
Your Nexplanon was removed today and is no longer preventing pregnancy.  If you have sex, remember to use condoms to prevent pregnancy and to prevent sexually transmitted infections.  Leave the outside bandage on for 24 hours.  Leave the smaller bandages on for 3-5 days or until they fall off on their own.  Keep the area clean and dry for 3-5 days.  There is usually bruising or swelling at and around the removal site for a few days to a week after the removal.  If you see redness or pus draining from the removal site, call us immediately.  We would like you to return to the clinic for a follow-up visit in 1 month.  You can call [facility] 24 hours a day with any questions or concerns.  There is always a nurse or doctor available to take your call.  Call 9-1-1 if you have a life-threatening emergency.  For anything else, please call us at [phone] before heading to the ER. 

## 2021-09-03 NOTE — Progress Notes (Signed)
History was provided by the patient.  Krista Carroll is a 20 y.o. female who is here for nexplanon, pcos, prediabetes.  Pediatricians, [facility]   HPI:  Pt reports she wants to get her nexplanon removed. She was taking metformin but it was hurting her stomach. Feels like the nexplanon has made her gain weight (even though she is the same weight as when we met her).   She is not planning to get pregnant any time soon. Partner was hoping to get pregnant at some point but she is not.   Denies itching, burning, discharge. She thinks the BV is gone. Denies pain with sex or vaginal bleeding.   No LMP recorded.   Patient Active Problem List   Diagnosis Date Noted   PCOS (polycystic ovarian syndrome) 09/03/2021   Morbid obesity ([place]) 09/03/2021   Prediabetes 09/03/2021   Irregular menses 04/06/2021   Nexplanon in place 04/06/2021    Current Outpatient Medications on File Prior to Visit  Medication Sig Dispense Refill   etonogestrel (NEXPLANON) 68 MG IMPL implant 1 each (68 mg total) by Subdermal route once. 1 each 0   metFORMIN (GLUCOPHAGE XR) 500 MG 24 hr tablet Take 1 tablet (500 mg total) by mouth daily with breakfast for 7 days, THEN 2 tablets (1,000 mg total) daily with breakfast for 7 days, THEN 3 tablets (1,500 mg total) daily with breakfast for 14 days. 90 tablet 3   No current facility-administered medications on file prior to visit.    No Known Allergies   Physical Exam:    Vitals:   09/03/21 1342  BP: 137/78  Pulse: 80  Weight: (!) 338 lb 9.6 oz (153.6 kg)  Height: 5' 4.67" (1.643 m)    Blood pressure percentiles are not available for patients who are 18 years or older.  Physical Exam Vitals and nursing note reviewed.  Constitutional:      General: She is not in acute distress.    Appearance: She is well-developed. She is obese.  Neck:     Thyroid: No thyromegaly.  Cardiovascular:     Rate and Rhythm: Normal rate and regular rhythm.     Heart sounds: No  murmur heard. Pulmonary:     Breath sounds: Normal breath sounds.  Abdominal:     Palpations: Abdomen is soft. There is no mass.     Tenderness: There is no abdominal tenderness. There is no guarding.  Musculoskeletal:     Right lower leg: No edema.     Left lower leg: No edema.  Lymphadenopathy:     Cervical: No cervical adenopathy.  Skin:    General: Skin is warm.     Capillary Refill: Capillary refill takes less than 2 seconds.     Findings: No rash.  Neurological:     General: No focal deficit present.     Mental Status: She is alert.     Comments: No tremor  Psychiatric:        Mood and Affect: Mood normal.    Assessment/Plan: 1. Encounter for Nexplanon removal See procedure note. Tolerated well.   2. PCOS (polycystic ovarian syndrome) Will repeat A1C today. Wishes to start OCP instead. Sent to pharmacy. Discussed risks with past elevated BP- she says she is just nervous when she is here. BP somewhat improved today.  - norethindrone-ethinyl estradiol-iron (JUNEL FE 1.5/30) 1.5-30 MG-MCG tablet; Take 1 tablet by mouth daily.  Dispense: 84 tablet; Refill: 3  3. Morbid obesity ([place]) Weight is overall stable- she was thinking  nexplanon was causing weight gain, though we don't have evidence for that here.   4. Prediabetes Repeat A1C. She is planning to start metformin back.  - Hemoglobin A1c  5. Routine screening for STI (sexually transmitted infection) Per protocol.  - Urine cytology ancillary only  Return in 3 months for OCP f/u   Jonathon Resides, FNP

## 2021-09-03 NOTE — Progress Notes (Signed)

## 2021-09-04 LAB — HEMOGLOBIN A1C
Hgb A1c MFr Bld: 6.1 % of total Hgb — ABNORMAL HIGH (ref <5.7)
Mean Plasma Glucose: 128 mg/dL
eAG (mmol/L): 7.1 mmol/L

## 2021-09-08 LAB — URINE CYTOLOGY ANCILLARY ONLY
Bacterial Vaginitis-Urine: NEGATIVE
Candida Urine: NEGATIVE
Chlamydia: NEGATIVE
Comment: NEGATIVE
Comment: NEGATIVE
Comment: NORMAL
Neisseria Gonorrhea: NEGATIVE
Trichomonas: NEGATIVE

## 2021-10-29 ENCOUNTER — Telehealth: Payer: Medicaid Other | Admitting: Family

## 2021-10-29 DIAGNOSIS — E282 Polycystic ovarian syndrome: Secondary | ICD-10-CM

## 2021-10-29 DIAGNOSIS — N939 Abnormal uterine and vaginal bleeding, unspecified: Secondary | ICD-10-CM

## 2021-10-29 DIAGNOSIS — N921 Excessive and frequent menstruation with irregular cycle: Secondary | ICD-10-CM | POA: Diagnosis not present

## 2021-10-29 DIAGNOSIS — N926 Irregular menstruation, unspecified: Secondary | ICD-10-CM

## 2021-10-29 NOTE — Progress Notes (Signed)
Virtual Visit Consent   Krista Carroll, you are scheduled for a virtual visit with a [facility] provider today.     Just as with appointments in the office, your consent must be obtained to participate.  Your consent will be active for this visit and any virtual visit you may have with one of our providers in the next 365 days.     If you have a MyChart account, a copy of this consent can be sent to you electronically.  All virtual visits are billed to your insurance company just like a traditional visit in the office.    As this is a virtual visit, video technology does not allow for your provider to perform a traditional examination.  This may limit your provider's ability to fully assess your condition.  If your provider identifies any concerns that need to be evaluated in person or the need to arrange testing (such as labs, EKG, etc.), we will make arrangements to do so.     Although advances in technology are sophisticated, we cannot ensure that it will always work on either your end or our end.  If the connection with a video visit is poor, the visit may have to be switched to a telephone visit.  With either a video or telephone visit, we are not always able to ensure that we have a secure connection.     I need to obtain your verbal consent now.   Are you willing to proceed with your visit today?    Krista Carroll has provided verbal consent on 10/29/2021 for a virtual visit (video or telephone).   Jannifer Rodney, FNP   Date: 10/29/2021 6:38 PM   Virtual Visit via Video Note   I, Jannifer Rodney, connected with  Krista Carroll  ([id number], [birth year]) on 10/29/21 at  6:30 PM EST by a video-enabled telemedicine application and verified that I am speaking with the correct person using two identifiers.  Location: Patient: Virtual Visit Location Patient: Other: car Provider: Virtual Visit Location Provider: Home Office   I discussed the limitations of evaluation and management by  telemedicine and the availability of in person appointments. The patient expressed understanding and agreed to proceed.    History of Present Illness: Krista Carroll is a 21 y.o. who identifies as a female who was assigned female at birth, and is being seen today for vaginal bleeding that started Sunday morning and stopped today. She reports large clots. She has PCOS and has irregular menses. She has OC prescribed to her, but has not started this. She had a nexplanon and was removed 09/03/21.    She also reports abdominal pain and back pain with this. She reports aching pain of 6 out 10. She is sexually active. She took a pregnancy test in January that was negative.   She reports her pain is resolved and thinks the bleeding has stopped.  HPI: Vaginal Bleeding The patient's pertinent negatives include no genital itching or genital odor. The pain is moderate. The problem affects both sides.   Problems:  Patient Active Problem List   Diagnosis Date Noted   PCOS (polycystic ovarian syndrome) 09/03/2021   Morbid obesity ([facility]) 09/03/2021   Prediabetes 09/03/2021   Irregular menses 04/06/2021    Allergies: No Known Allergies Medications:  Current Outpatient Medications:    metFORMIN (GLUCOPHAGE XR) 500 MG 24 hr tablet, Take 1 tablet (500 mg total) by mouth daily with breakfast for 7 days, THEN 2 tablets (1,000 mg total) daily with breakfast  for 7 days, THEN 3 tablets (1,500 mg total) daily with breakfast for 14 days., Disp: 90 tablet, Rfl: 3   norethindrone-ethinyl estradiol-iron (JUNEL FE 1.5/30) 1.5-30 MG-MCG tablet, Take 1 tablet by mouth daily., Disp: 84 tablet, Rfl: 3  Observations/Objective: Patient is well-developed, well-nourished in no acute distress.  Resting comfortably   Head is normocephalic, atraumatic.  No labored breathing.  Speech is clear and coherent with logical content.  Patient is alert and oriented at baseline.    Assessment and Plan: 1. Vagina bleeding  2.  Menorrhagia with irregular cycle  3. PCOS (polycystic ovarian syndrome)  4. Morbid obesity ([facility])  5. Irregular menses  Given symptoms resolved Start OC  If bleeding restarts she may need transvaginal US   Follow Up Instructions: I discussed the assessment and treatment plan with the patient. The patient was provided an opportunity to ask questions and all were answered. The patient agreed with the plan and demonstrated an understanding of the instructions.  A copy of instructions were sent to the patient via MyChart unless otherwise noted below.     The patient was advised to call back or seek an in-person evaluation if the symptoms worsen or if the condition fails to improve as anticipated.  Time:  I spent 12 minutes with the patient via telehealth technology discussing the above problems/concerns.    Jannifer Rodney, FNP

## 2021-12-01 ENCOUNTER — Ambulatory Visit: Payer: Medicaid Other | Admitting: Pediatrics

## 2021-12-03 ENCOUNTER — Ambulatory Visit: Payer: Medicaid Other | Admitting: Family

## 2022-01-08 ENCOUNTER — Telehealth: Payer: Medicaid Other

## 2022-04-02 ENCOUNTER — Telehealth: Payer: Medicaid Other | Admitting: Family Medicine

## 2022-04-02 DIAGNOSIS — N898 Other specified noninflammatory disorders of vagina: Secondary | ICD-10-CM

## 2022-04-02 DIAGNOSIS — R350 Frequency of micturition: Secondary | ICD-10-CM

## 2022-04-02 NOTE — Progress Notes (Signed)
   Thank you for the details you included in the comment boxes. Those details are very helpful in determining the best course of treatment for you and help [place] to provide the best care.Because Ms. Arvelo, we recommend that you convert this visit to a video visit in order for the provider to better assess what is going on.  The provider will be able to give you a more accurate diagnosis and treatment plan if we can more freely discuss your symptoms and with the addition of a virtual examination.   If you convert to a video visit, we will bill your insurance (similar to an office visit) and you will not be charged for this e-Visit. You will be able to stay at home and speak with the first available [facility] advanced practice provider. The link to do a video visit is in the drop down Menu tab of your Welcome screen in MyChart.

## 2022-04-21 ENCOUNTER — Telehealth: Payer: Medicaid Other | Admitting: Family

## 2022-11-09 DIAGNOSIS — R7303 Prediabetes: Secondary | ICD-10-CM | POA: Diagnosis not present

## 2022-11-09 DIAGNOSIS — E282 Polycystic ovarian syndrome: Secondary | ICD-10-CM | POA: Diagnosis not present

## 2022-11-09 DIAGNOSIS — R0683 Snoring: Secondary | ICD-10-CM | POA: Diagnosis not present

## 2022-11-09 DIAGNOSIS — Z1330 Encounter for screening examination for mental health and behavioral disorders, unspecified: Secondary | ICD-10-CM | POA: Diagnosis not present

## 2022-11-15 DIAGNOSIS — R7303 Prediabetes: Secondary | ICD-10-CM | POA: Diagnosis not present

## 2022-11-15 DIAGNOSIS — R0683 Snoring: Secondary | ICD-10-CM | POA: Diagnosis not present

## 2022-11-15 DIAGNOSIS — Z6841 Body Mass Index (BMI) 40.0 and over, adult: Secondary | ICD-10-CM | POA: Diagnosis not present

## 2022-11-15 DIAGNOSIS — R030 Elevated blood-pressure reading, without diagnosis of hypertension: Secondary | ICD-10-CM | POA: Diagnosis not present

## 2022-11-19 DIAGNOSIS — G4710 Hypersomnia, unspecified: Secondary | ICD-10-CM | POA: Diagnosis not present

## 2022-11-19 DIAGNOSIS — Z6841 Body Mass Index (BMI) 40.0 and over, adult: Secondary | ICD-10-CM | POA: Diagnosis not present

## 2022-12-02 ENCOUNTER — Ambulatory Visit: Payer: BC Managed Care – PPO | Admitting: Nurse Practitioner

## 2022-12-02 ENCOUNTER — Encounter: Payer: Self-pay | Admitting: Nurse Practitioner

## 2022-12-02 VITALS — BP 122/70 | HR 83 | Temp 97.4°F | Ht 63.75 in | Wt 377.8 lb

## 2022-12-02 DIAGNOSIS — Z23 Encounter for immunization: Secondary | ICD-10-CM | POA: Diagnosis not present

## 2022-12-02 DIAGNOSIS — Z0000 Encounter for general adult medical examination without abnormal findings: Secondary | ICD-10-CM | POA: Diagnosis not present

## 2022-12-02 DIAGNOSIS — E282 Polycystic ovarian syndrome: Secondary | ICD-10-CM | POA: Diagnosis not present

## 2022-12-02 DIAGNOSIS — E559 Vitamin D deficiency, unspecified: Secondary | ICD-10-CM | POA: Diagnosis not present

## 2022-12-02 MED ORDER — VITAMIN D (ERGOCALCIFEROL) 1.25 MG (50000 UNIT) PO CAPS: 50000.0000 [IU] | ORAL_CAPSULE | ORAL | 2 refills | Status: DC

## 2022-12-02 MED ORDER — METFORMIN HCL ER 500 MG PO TB24: 500.0000 mg | ORAL_TABLET | Freq: Every day | ORAL | 2 refills | Status: DC

## 2022-12-02 NOTE — Progress Notes (Signed)
@Patient  ID: Krista Carroll, female    DOB: 2000-10-18, 22 y.o.   MRN: [id number]  Chief Complaint  Patient presents with   Annual Exam    Fasting     Referring provider: No ref. provider found   HPI  22 year old female with history of PCOS  Patient presents today to establish care.  She would like a physical today including vaccines.  She is due for Tdap.  She recently had labs through [facility].  It was noted that she has vitamin D deficiency.  We will send in a prescription for vitamin D.  She states that her and her partner are trying to get pregnant.  She was started on metformin for PCOS by her previous primary care physician but she has ran out of this medication.  She does have a history of prediabetes.  We will start her back on metformin today.  Patient is currently seeing weight loss clinic to try to lose weight.  We discussed that this would help with trying to get pregnant.  Patient states that she is currently having regular menstrual cycles.  We will refer her to OB/GYN for further management of PCOS fertility management. Denies f/c/s, n/v/d, hemoptysis, PND, leg swelling Denies chest pain or edema      No Known Allergies  Immunization History  Administered Date(s) Administered   Influenza-Unspecified 10/21/2022   PFIZER(Purple Top)SARS-COV-2 Vaccination 01/18/2020   Tdap 12/02/2022    Past Medical History:  Diagnosis Date   Medical history non-contributory     Tobacco History: Social History   Tobacco Use  Smoking Status Never  Smokeless Tobacco Never   Counseling given: Not Answered   Outpatient Encounter Medications as of 12/02/2022  Medication Sig   metFORMIN (GLUCOPHAGE-XR) 500 MG 24 hr tablet Take 1 tablet (500 mg total) by mouth daily with breakfast.   Vitamin D, Ergocalciferol, (DRISDOL) 1.25 MG (50000 UNIT) CAPS capsule Take 1 capsule (50,000 Units total) by mouth every 7 (seven) days.   norethindrone-ethinyl estradiol-iron (JUNEL FE  1.5/30) 1.5-30 MG-MCG tablet Take 1 tablet by mouth daily. (Patient not taking: Reported on 12/02/2022)   [DISCONTINUED] metFORMIN (GLUCOPHAGE XR) 500 MG 24 hr tablet Take 1 tablet (500 mg total) by mouth daily with breakfast for 7 days, THEN 2 tablets (1,000 mg total) daily with breakfast for 7 days, THEN 3 tablets (1,500 mg total) daily with breakfast for 14 days. (Patient not taking: Reported on 12/02/2022)   No facility-administered encounter medications on file as of 12/02/2022.     Review of Systems  Review of Systems  Constitutional: Negative.   HENT: Negative.    Cardiovascular: Negative.   Gastrointestinal: Negative.   Allergic/Immunologic: Negative.   Neurological: Negative.   Psychiatric/Behavioral: Negative.         Physical Exam  BP 122/70   Pulse 83   Temp (!) 97.4 F (36.3 C)   Ht 5' 3.75" (1.619 m)   Wt (!) 377 lb 12.8 oz (171.4 kg)   LMP 11/04/2022   SpO2 100%   BMI 65.36 kg/m   Wt Readings from Last 5 Encounters:  12/02/22 (!) 377 lb 12.8 oz (171.4 kg)  09/03/21 (!) 338 lb 9.6 oz (153.6 kg) (>99 %, Z= 2.98)*  05/26/21 (!) 330 lb 3.2 oz (149.8 kg) (>99 %, Z= 2.91)*  04/06/21 (!) 334 lb (151.5 kg) (>99 %, Z= 2.91)*  05/30/20 290 lb (131.5 kg) (>99 %, Z= 2.67)*   * Growth percentiles are based on CDC (Girls, 2-20 Years) data.  Physical Exam Vitals and nursing note reviewed.  Constitutional:      General: She is not in acute distress.    Appearance: She is well-developed.  Cardiovascular:     Rate and Rhythm: Normal rate and regular rhythm.  Pulmonary:     Effort: Pulmonary effort is normal.     Breath sounds: Normal breath sounds.  Neurological:     Mental Status: She is alert and oriented to person, place, and time.      Lab Results:  CBC    Component Value Date/Time   WBC 12.4 04/06/2021 1514   RBC 4.69 04/06/2021 1514   HGB 12.0 04/06/2021 1514   HCT 37.4 04/06/2021 1514   PLT 448 (H) 04/06/2021 1514   MCV 79.7 04/06/2021 1514    MCH 25.6 04/06/2021 1514   MCHC 32.1 04/06/2021 1514   RDW 14.8 04/06/2021 1514   LYMPHSABS 2,889 04/06/2021 1514   EOSABS 211 04/06/2021 1514   BASOSABS 37 04/06/2021 1514    BMET    Component Value Date/Time   NA 136 04/06/2021 1514   K 4.3 04/06/2021 1514   CL 102 04/06/2021 1514   CO2 26 04/06/2021 1514   GLUCOSE 89 04/06/2021 1514   BUN 9 04/06/2021 1514   CREATININE 0.63 04/06/2021 1514   CALCIUM 9.5 04/06/2021 1514      Assessment & Plan:   PCOS (polycystic ovarian syndrome) - Ambulatory referral to Obstetrics / Gynecology - metFORMIN (GLUCOPHAGE-XR) 500 MG 24 hr tablet; Take 1 tablet (500 mg total) by mouth daily with breakfast.  Dispense: 30 tablet; Refill: 2   2. Vitamin D deficiency  - Vitamin D, Ergocalciferol, (DRISDOL) 1.25 MG (50000 UNIT) CAPS capsule; Take 1 capsule (50,000 Units total) by mouth every 7 (seven) days.  Dispense: 5 capsule; Refill: 2    Follow up:  Follow up in 6 months     Fenton Foy, NP 12/02/2022

## 2022-12-02 NOTE — Patient Instructions (Addendum)
1. PCOS (polycystic ovarian syndrome)  - Ambulatory referral to Obstetrics / Gynecology - metFORMIN (GLUCOPHAGE-XR) 500 MG 24 hr tablet; Take 1 tablet (500 mg total) by mouth daily with breakfast.  Dispense: 30 tablet; Refill: 2  2. Vitamin D deficiency  - Vitamin D, Ergocalciferol, (DRISDOL) 1.25 MG (50000 UNIT) CAPS capsule; Take 1 capsule (50,000 Units total) by mouth every 7 (seven) days.  Dispense: 5 capsule; Refill: 2  3. Routine adult health maintenance  Vaccine given in office / tdap    Follow up:  Follow up in 6 months      Vitamin D Deficiency Vitamin D deficiency is when your body does not have enough vitamin D. Vitamin D is important to your body because: It helps the body maintain calcium and phosphorus levels. These are important minerals. It plays a role in bone health. It reduces inflammation. It improves the body's defense system (immune system). If vitamin D deficiency is severe, it can cause a condition in which your bones become soft. In adults, this condition is called osteomalacia. In children, this condition is called rickets. What are the causes? This condition may be caused by: Not eating enough foods that contain vitamin D. Not getting enough natural sun exposure. Having certain digestive system diseases that make it difficult for your body to absorb vitamin D. These diseases include Crohn's disease, long-term (chronic) pancreatitis, and cystic fibrosis. Having had a surgery in which a part of the stomach or a part of the small intestine was removed. What increases the risk? You are more likely to develop this condition if you: Are an older adult. Do not spend much time outdoors. Live in a long-term care facility. Have dark skin. Take certain medicines, such as steroid medicines or certain seizure medicines. Are overweight or obese. Have chronic kidney or liver disease. What are the signs or symptoms? In mild cases of vitamin D deficiency, there  may not be any symptoms. If the condition is severe, symptoms may include: Bone pain. Muscle pain. Not being able to walk normally (abnormal gait). Broken bones caused by a minor injury. Joint pain. How is this diagnosed? This condition may be diagnosed with blood tests. Imaging tests such as X-rays may also be done to look for changes in the bone. How is this treated? Treatment may include taking supplements as told by your health care provider. Your health care provider will tell you what dose is best for you. Supplements may include: Vitamin D. Calcium. Follow these instructions at home: Eating and drinking Eat foods that contain vitamin D, such as: Dairy products, cereals, or juices that have vitamin D added to them (are fortified). Check the label. Fish, such as salmon or trout. Eggs. The vitamin D is in the yolk. Mushrooms that were treated with UV light. Beef liver. The items listed above may not be a complete list of foods and beverages you can eat and drink. Contact a dietitian for more information. General instructions Take over-the-counter and prescription medicines only as told by your health care provider. Take supplements only as told by your health care provider. Get regular, safe exposure to natural sunlight. Do not use a tanning bed. Maintain a healthy weight. Lose weight if needed. Keep all follow-up visits. This is important. How is this prevented? You can get vitamin D by: Eating foods that naturally contain vitamin D. Eating or drinking products that have been fortified with vitamin D, such as cereals, juices, and dairy products, including milk. Taking a vitamin D  supplement or a multivitamin that contains vitamin D. Being in the sun. Your body naturally makes vitamin D when your skin is exposed to sunlight. Your body changes the sunlight into a form of the vitamin that it can use. Contact a health care provider if: Your symptoms do not go away. You feel  nauseous or you vomit. You have fewer bowel movements than usual or are constipated. Summary Vitamin D deficiency is when your body does not have enough vitamin D. Vitamin D helps to keep your bones healthy. Vitamin D deficiency is primarily treated by taking supplements. Your health care provider will suggest what dose is best for you. You can get vitamin D by eating foods that contain vitamin D, by being in the sun, and by taking a vitamin D supplement or a multivitamin that contains vitamin D. This information is not intended to replace advice given to you by your health care provider. Make sure you discuss any questions you have with your health care provider. Document Revised: 06/05/2021 Document Reviewed: 06/05/2021 Elsevier Patient Education  [employer].    Polycystic Ovary Syndrome  Polycystic ovarian syndrome (PCOS) is a common hormonal disorder among women of reproductive age. In most women with PCOS, small fluid-filled sacs (cysts) grow on the ovaries. PCOS can cause problems with menstrual periods and make it hard to get and stay pregnant. If this condition is not treated, it can lead to serious health problems, such as diabetes and heart disease. What are the causes? The cause of this condition is not known. It may be due to certain factors, such as: Irregular menstrual cycle. High levels of certain hormones. Problems with the hormone that helps to control blood sugar (insulin). Certain genes. What increases the risk? You are more likely to develop this condition if you: Have a family history of PCOS or type 2 diabetes. Are overweight, eat unhealthy foods, and are not active. These factors may cause problems with blood sugar control, which can contribute to PCOS or PCOS symptoms. What are the signs or symptoms? Symptoms of this condition include: Ovarian cysts and sometimes pelvic pain. Menstrual periods that are not regular or are too heavy. Inability to get or  stay pregnant. Increased growth of hair on the face, chest, stomach, back, thumbs, thighs, or toes. Acne or oily skin. Acne may develop during adulthood, and it may not get better with treatment. Weight gain or obesity. Patches of thickened and dark brown or black skin on the neck, arms, breasts, or thighs. How is this diagnosed? This condition is diagnosed based on: Your medical history. A physical exam that includes a pelvic exam. Your health care provider may look for areas of increased hair growth on your skin. Tests, such as: An ultrasound to check the ovaries for cysts and to view the lining of the uterus. Blood tests to check levels of sugar (glucose), female hormone (testosterone), and female hormones (estrogen and progesterone). How is this treated? There is no cure for this condition, but treatment can help to manage symptoms and prevent more health problems from developing. Treatment varies depending on your symptoms and if you want to have a baby or if you need birth control. Treatment may include: Making nutrition and lifestyle changes. Taking the progesterone hormone to start a menstrual period. Taking birth control pills to help you have regular menstrual periods. Taking medicines such as: Medicines to make you ovulate, if you want to get pregnant. Medicine to reduce extra hair growth. Having surgery in  severe cases. This may involve making small holes in one or both of your ovaries. This decreases the amount of testosterone that your body makes. Follow these instructions at home: Take over-the-counter and prescription medicines only as told by your health care provider. Follow a healthy meal plan that includes lean proteins, complex carbohydrates, fresh fruits and vegetables, low-fat dairy products, healthy fats, and fiber. If you are overweight, lose weight as told by your health care provider. Your health care provider can determine how much weight loss is best for you and  can help you lose weight safely. Keep all follow-up visits. This is important. Contact a health care provider if: Your symptoms do not get better with medicine. Your symptoms get worse or you develop new symptoms. Summary Polycystic ovarian syndrome (PCOS) is a common hormonal disorder among women of reproductive age. PCOS can cause problems with menstrual periods and make it hard to get and stay pregnant. If this condition is not treated, it can lead to serious health problems, such as diabetes and heart disease. There is no cure for this condition, but treatment can help to manage symptoms and prevent more health problems from developing. This information is not intended to replace advice given to you by your health care provider. Make sure you discuss any questions you have with your health care provider. Document Revised: 02/07/2020 Document Reviewed: 02/07/2020 Elsevier Patient Education  [place].

## 2022-12-02 NOTE — Assessment & Plan Note (Signed)
-   Ambulatory referral to Obstetrics / Gynecology - metFORMIN (GLUCOPHAGE-XR) 500 MG 24 hr tablet; Take 1 tablet (500 mg total) by mouth daily with breakfast.  Dispense: 30 tablet; Refill: 2   2. Vitamin D deficiency  - Vitamin D, Ergocalciferol, (DRISDOL) 1.25 MG (50000 UNIT) CAPS capsule; Take 1 capsule (50,000 Units total) by mouth every 7 (seven) days.  Dispense: 5 capsule; Refill: 2    Follow up:  Follow up in 6 months

## 2022-12-06 DIAGNOSIS — R0683 Snoring: Secondary | ICD-10-CM | POA: Diagnosis not present

## 2022-12-06 DIAGNOSIS — Z6841 Body Mass Index (BMI) 40.0 and over, adult: Secondary | ICD-10-CM | POA: Diagnosis not present

## 2022-12-06 DIAGNOSIS — Z7189 Other specified counseling: Secondary | ICD-10-CM | POA: Diagnosis not present

## 2022-12-06 DIAGNOSIS — F54 Psychological and behavioral factors associated with disorders or diseases classified elsewhere: Secondary | ICD-10-CM | POA: Diagnosis not present

## 2023-01-05 ENCOUNTER — Ambulatory Visit: Payer: BC Managed Care – PPO | Admitting: Nurse Practitioner

## 2023-06-06 ENCOUNTER — Ambulatory Visit: Payer: Self-pay | Admitting: Nurse Practitioner

## 2023-07-27 ENCOUNTER — Other Ambulatory Visit: Payer: Self-pay

## 2023-07-27 ENCOUNTER — Emergency Department (HOSPITAL_BASED_OUTPATIENT_CLINIC_OR_DEPARTMENT_OTHER)
Admission: EM | Admit: 2023-07-27 | Discharge: 2023-07-27 | Discharge status: Against Medical Advice | Payer: Self-pay | Attending: Emergency Medicine | Admitting: Emergency Medicine

## 2023-07-27 DIAGNOSIS — Z5321 Procedure and treatment not carried out due to patient leaving prior to being seen by health care provider: Secondary | ICD-10-CM | POA: Insufficient documentation

## 2023-07-27 DIAGNOSIS — R42 Dizziness and giddiness: Secondary | ICD-10-CM | POA: Insufficient documentation

## 2023-07-27 NOTE — ED Notes (Signed)
Pt decided to leave after vital signs. Pt states the reason she came was that her temperature was low. She endorsed some dizziness this morning but states she feels good to leave if her temperature was normal. This nurse encouraged her to stay and have a workup since she was dizzy, but she declined. Pt ambulated with steady gait out the door. NAD noted.

## 2023-10-07 ENCOUNTER — Other Ambulatory Visit: Payer: Self-pay | Admitting: Nurse Practitioner

## 2023-10-07 DIAGNOSIS — E559 Vitamin D deficiency, unspecified: Secondary | ICD-10-CM

## 2023-10-11 ENCOUNTER — Other Ambulatory Visit: Payer: Self-pay | Admitting: Nurse Practitioner

## 2023-10-11 DIAGNOSIS — E282 Polycystic ovarian syndrome: Secondary | ICD-10-CM

## 2023-11-09 ENCOUNTER — Telehealth: Payer: 59 | Admitting: Family

## 2023-11-09 NOTE — Progress Notes (Signed)
 Discussed we do not do physicals through video visits at this time. Will no charge visit.   Jannifer Rodney, FNP

## 2023-11-17 ENCOUNTER — Ambulatory Visit: Payer: 59 | Admitting: Nurse Practitioner

## 2023-11-17 VITALS — BP 144/89 | HR 81 | Temp 97.9°F | Wt 392.4 lb

## 2023-11-17 DIAGNOSIS — R7303 Prediabetes: Secondary | ICD-10-CM | POA: Diagnosis not present

## 2023-11-17 DIAGNOSIS — Z6841 Body Mass Index (BMI) 40.0 and over, adult: Secondary | ICD-10-CM

## 2023-11-17 DIAGNOSIS — E66813 Obesity, class 3: Secondary | ICD-10-CM

## 2023-11-17 DIAGNOSIS — Z0000 Encounter for general adult medical examination without abnormal findings: Secondary | ICD-10-CM | POA: Diagnosis not present

## 2023-11-17 DIAGNOSIS — Z1329 Encounter for screening for other suspected endocrine disorder: Secondary | ICD-10-CM

## 2023-11-17 DIAGNOSIS — E282 Polycystic ovarian syndrome: Secondary | ICD-10-CM

## 2023-11-17 LAB — POCT GLYCOSYLATED HEMOGLOBIN (HGB A1C): Hemoglobin A1C: 6.1 % — AB (ref 4.0–5.6)

## 2023-11-17 MED ORDER — WEGOVY 0.25 MG/0.5ML ~~LOC~~ SOAJ: 0.2500 mg | SUBCUTANEOUS | 2 refills | Status: DC

## 2023-11-17 NOTE — Patient Instructions (Signed)
 1. Routine adult health maintenance (Primary)  - POCT glycosylated hemoglobin (Hb A1C) - Semaglutide-Weight Management (WEGOVY) 0.25 MG/0.5ML SOAJ; Inject 0.25 mg into the skin once a week.  Dispense: 2 mL; Refill: 2  2. PCOS (polycystic ovarian syndrome)  - Semaglutide-Weight Management (WEGOVY) 0.25 MG/0.5ML SOAJ; Inject 0.25 mg into the skin once a week.  Dispense: 2 mL; Refill: 2  3. Morbid obesity (HCC)  - Semaglutide-Weight Management (WEGOVY) 0.25 MG/0.5ML SOAJ; Inject 0.25 mg into the skin once a week.  Dispense: 2 mL; Refill: 2  4. Prediabetes  - Semaglutide-Weight Management (WEGOVY) 0.25 MG/0.5ML SOAJ; Inject 0.25 mg into the skin once a week.  Dispense: 2 mL; Refill: 2

## 2023-11-17 NOTE — Progress Notes (Signed)
 Subjective   Patient ID: Krista Carroll, female    DOB: 2001-07-10, 23 y.o.   MRN: [id number]  Chief Complaint  Patient presents with   Annual Exam    Patient stated that she wants blood work    Medical Management of Chronic Issues    Referring provider: Ivonne Andrew, NP  Krista Carroll is a 23 y.o. female with Past Medical History: No date: Medical history non-contributory   HPI  Patient presents today for physical.  She does have history of PCOS and morbid obesity.  She does have prediabetes.  A1c in office today is 6.1.  Will order [facility] today.  Patient thinks that her insurance will cover this for weight loss.  Will check labs today. Denies f/c/s, n/v/d, hemoptysis, PND, leg swelling Denies chest pain or edema       No Known Allergies  Immunization History  Administered Date(s) Administered   Influenza-Unspecified 10/21/2022   PFIZER(Purple Top)SARS-COV-2 Vaccination 01/18/2020   Tdap 12/02/2022    Tobacco History: Social History   Tobacco Use  Smoking Status Never  Smokeless Tobacco Never   Counseling given: Not Answered   Outpatient Encounter Medications as of 11/17/2023  Medication Sig   metFORMIN (GLUCOPHAGE-XR) 500 MG 24 hr tablet TAKE 1 TABLET BY MOUTH EVERY DAY WITH BREAKFAST   Semaglutide-Weight Management (WEGOVY) 0.25 MG/0.5ML SOAJ Inject 0.25 mg into the skin once a week.   Vitamin D, Ergocalciferol, (DRISDOL) 1.25 MG (50000 UNIT) CAPS capsule TAKE 1 CAPSULE (50,000 UNITS TOTAL) BY MOUTH EVERY 7 (SEVEN) DAYS   norethindrone-ethinyl estradiol-iron (JUNEL FE 1.5/30) 1.5-30 MG-MCG tablet Take 1 tablet by mouth daily. (Patient not taking: Reported on 12/02/2022)   No facility-administered encounter medications on file as of 11/17/2023.    Review of Systems  Review of Systems  Constitutional: Negative.   HENT: Negative.    Cardiovascular: Negative.   Gastrointestinal: Negative.   Allergic/Immunologic: Negative.   Neurological: Negative.    Psychiatric/Behavioral: Negative.       Objective:   BP (!) 144/89   Pulse 81   Temp 97.9 F (36.6 C) (Oral)   Wt (!) 392 lb 6.4 oz (178 kg)   SpO2 98%   BMI 67.88 kg/m   Wt Readings from Last 5 Encounters:  11/17/23 (!) 392 lb 6.4 oz (178 kg)  12/02/22 (!) 377 lb 12.8 oz (171.4 kg)  09/03/21 (!) 338 lb 9.6 oz (153.6 kg) (>99%, Z= 2.98)*  05/26/21 (!) 330 lb 3.2 oz (149.8 kg) (>99%, Z= 2.91)*  04/06/21 (!) 334 lb (151.5 kg) (>99%, Z= 2.91)*   * Growth percentiles are based on CDC (Girls, 2-20 Years) data.     Physical Exam Vitals and nursing note reviewed.  Constitutional:      General: She is not in acute distress.    Appearance: She is well-developed.  Cardiovascular:     Rate and Rhythm: Normal rate and regular rhythm.  Pulmonary:     Effort: Pulmonary effort is normal.     Breath sounds: Normal breath sounds.  Neurological:     Mental Status: She is alert and oriented to person, place, and time.       Assessment & Plan:   Routine adult health maintenance -     POCT glycosylated hemoglobin (Hb A1C) -     Wegovy; Inject 0.25 mg into the skin once a week.  Dispense: 2 mL; Refill: 2  PCOS (polycystic ovarian syndrome) -     WUJWJX; Inject 0.25 mg into the skin  once a week.  Dispense: 2 mL; Refill: 2  Morbid obesity (HCC) -     ZOXWRU; Inject 0.25 mg into the skin once a week.  Dispense: 2 mL; Refill: 2 -     CBC -     Comprehensive metabolic panel -     Lipid panel -     TSH  Prediabetes -     EAVWUJ; Inject 0.25 mg into the skin once a week.  Dispense: 2 mL; Refill: 2 -     CBC -     Comprehensive metabolic panel -     Lipid panel -     TSH  Thyroid disorder screening -     TSH      Return in about 3 months (around 02/17/2024).     Ivonne Andrew, NP 11/17/2023

## 2023-11-18 ENCOUNTER — Other Ambulatory Visit: Payer: Self-pay | Admitting: Nurse Practitioner

## 2023-11-18 DIAGNOSIS — R7303 Prediabetes: Secondary | ICD-10-CM

## 2023-11-18 DIAGNOSIS — E66813 Obesity, class 3: Secondary | ICD-10-CM

## 2023-11-18 DIAGNOSIS — E282 Polycystic ovarian syndrome: Secondary | ICD-10-CM

## 2023-11-18 LAB — LIPID PANEL
Chol/HDL Ratio: 4 ratio (ref 0.0–4.4)
Cholesterol, Total: 163 mg/dL (ref 100–199)
HDL: 41 mg/dL (ref >39)
LDL Chol Calc (NIH): 100 mg/dL — ABNORMAL HIGH (ref 0–99)
Triglycerides: 119 mg/dL (ref 0–149)
VLDL Cholesterol Cal: 22 mg/dL (ref 5–40)

## 2023-11-18 LAB — COMPREHENSIVE METABOLIC PANEL
ALT: 19 IU/L (ref 0–32)
AST: 16 IU/L (ref 0–40)
Albumin: 4 g/dL (ref 4.0–5.0)
Alkaline Phosphatase: 76 IU/L (ref 44–121)
BUN/Creatinine Ratio: 16 (ref 9–23)
BUN: 9 mg/dL (ref 6–20)
Bilirubin Total: 0.3 mg/dL (ref 0.0–1.2)
CO2: 27 mmol/L (ref 20–29)
Calcium: 9.5 mg/dL (ref 8.7–10.2)
Chloride: 103 mmol/L (ref 96–106)
Creatinine, Ser: 0.58 mg/dL (ref 0.57–1.00)
Globulin, Total: 3 g/dL (ref 1.5–4.5)
Glucose: 94 mg/dL (ref 70–99)
Potassium: 4.4 mmol/L (ref 3.5–5.2)
Sodium: 140 mmol/L (ref 134–144)
Total Protein: 7 g/dL (ref 6.0–8.5)
eGFR: 132 mL/min/{1.73_m2} (ref >59)

## 2023-11-18 LAB — CBC
Hematocrit: 37.5 % (ref 34.0–46.6)
Hemoglobin: 11.7 g/dL (ref 11.1–15.9)
MCH: 24.7 pg — ABNORMAL LOW (ref 26.6–33.0)
MCHC: 31.2 g/dL — ABNORMAL LOW (ref 31.5–35.7)
MCV: 79 fL (ref 79–97)
Platelets: 453 10*3/uL — ABNORMAL HIGH (ref 150–450)
RBC: 4.73 x10E6/uL (ref 3.77–5.28)
RDW: 15.3 % (ref 11.7–15.4)
WBC: 11.4 10*3/uL — ABNORMAL HIGH (ref 3.4–10.8)

## 2023-11-18 LAB — TSH: TSH: 1.61 u[IU]/mL (ref 0.450–4.500)

## 2023-11-18 MED ORDER — MOUNJARO 2.5 MG/0.5ML ~~LOC~~ SOAJ: 2.5000 mg | SUBCUTANEOUS | 2 refills | Status: DC

## 2023-11-18 NOTE — Telephone Encounter (Signed)
 Spoke to pt concerning labs. She is fine now and will contact her insurance concerning weight loss med. KH

## 2023-11-22 ENCOUNTER — Other Ambulatory Visit: Payer: Self-pay

## 2023-11-23 ENCOUNTER — Encounter: Payer: Self-pay | Admitting: Nurse Practitioner

## 2023-11-24 ENCOUNTER — Other Ambulatory Visit: Payer: Self-pay

## 2024-01-04 ENCOUNTER — Ambulatory Visit: Admitting: Nurse Practitioner

## 2024-03-01 ENCOUNTER — Ambulatory Visit: Payer: Self-pay | Admitting: Nurse Practitioner

## 2024-03-02 ENCOUNTER — Ambulatory Visit: Admitting: Nurse Practitioner

## 2024-03-02 ENCOUNTER — Encounter: Payer: Self-pay | Admitting: Nurse Practitioner

## 2024-03-02 DIAGNOSIS — Z6841 Body Mass Index (BMI) 40.0 and over, adult: Secondary | ICD-10-CM | POA: Diagnosis not present

## 2024-03-02 DIAGNOSIS — E66813 Obesity, class 3: Secondary | ICD-10-CM

## 2024-03-02 DIAGNOSIS — R7303 Prediabetes: Secondary | ICD-10-CM

## 2024-03-02 LAB — POCT GLYCOSYLATED HEMOGLOBIN (HGB A1C): Hemoglobin A1C: 5.8 % — AB (ref 4.0–5.6)

## 2024-03-02 NOTE — Progress Notes (Signed)
 Subjective   Patient ID: Krista Carroll, female    DOB: 10/11/2000, 23 y.o.   MRN: [id number]  Chief Complaint  Patient presents with   Follow-up   Medical Management of Chronic Issues    Referring provider: Jerrlyn Morel, NP  Krista Carroll is a 23 y.o. female with Past Medical History: No date: Medical history non-contributory   HPI  Patient presents today for follow up.  She does have history of PCOS and morbid obesity.  She does have prediabetes.  A1c in office today is 5.8.  Will order Wegovy  today.  Patient thinks that her insurance will cover this for weight loss.  Will check labs today. Denies f/c/s, n/v/d, hemoptysis, PND, leg swelling Denies chest pain or edema.   No Known Allergies  Immunization History  Administered Date(s) Administered   Influenza-Unspecified 10/21/2022   PFIZER(Purple Top)SARS-COV-2 Vaccination 01/18/2020   Tdap 12/02/2022    Tobacco History: Social History   Tobacco Use  Smoking Status Never  Smokeless Tobacco Never   Counseling given: Not Answered   Outpatient Encounter Medications as of 03/02/2024  Medication Sig   lisdexamfetamine (VYVANSE) 40 MG capsule Take 50 mg by mouth daily.   metFORMIN  (GLUCOPHAGE -XR) 500 MG 24 hr tablet TAKE 1 TABLET BY MOUTH EVERY DAY WITH BREAKFAST   Vitamin D , Ergocalciferol , (DRISDOL ) 1.25 MG (50000 UNIT) CAPS capsule TAKE 1 CAPSULE (50,000 UNITS TOTAL) BY MOUTH EVERY 7 (SEVEN) DAYS   norethindrone-ethinyl estradiol-iron (JUNEL FE 1.5/30) 1.5-30 MG-MCG tablet Take 1 tablet by mouth daily. (Patient not taking: Reported on 12/02/2022)   tirzepatide  (MOUNJARO ) 2.5 MG/0.5ML Pen Inject 2.5 mg into the skin once a week.   No facility-administered encounter medications on file as of 03/02/2024.    Review of Systems  Review of Systems  Constitutional: Negative.   HENT: Negative.    Cardiovascular: Negative.   Gastrointestinal: Negative.   Allergic/Immunologic: Negative.   Neurological: Negative.    Psychiatric/Behavioral: Negative.       Objective:   BP 137/67   Pulse 80   Temp (!) 97.1 F (36.2 C) (Temporal)   Resp 18   Ht 5' 3.75 (1.619 m)   Wt (!) 374 lb 9.6 oz (169.9 kg)   LMP 02/29/2024 (Exact Date)   SpO2 98%   BMI 64.81 kg/m   Wt Readings from Last 5 Encounters:  03/02/24 (!) 374 lb 9.6 oz (169.9 kg)  11/17/23 (!) 392 lb 6.4 oz (178 kg)  12/02/22 (!) 377 lb 12.8 oz (171.4 kg)  09/03/21 (!) 338 lb 9.6 oz (153.6 kg) (>99%, Z= 2.98)*  05/26/21 (!) 330 lb 3.2 oz (149.8 kg) (>99%, Z= 2.91)*   * Growth percentiles are based on CDC (Girls, 2-20 Years) data.     Physical Exam Vitals and nursing note reviewed.  Constitutional:      General: She is not in acute distress.    Appearance: She is well-developed.   Cardiovascular:     Rate and Rhythm: Normal rate and regular rhythm.  Pulmonary:     Effort: Pulmonary effort is normal.     Breath sounds: Normal breath sounds.   Neurological:     Mental Status: She is alert and oriented to person, place, and time.       Assessment & Plan:   Morbid obesity (HCC) -     AMB Referral [facility] Care Management  Class 3 severe obesity due to excess calories without serious comorbidity with body mass index (BMI) of 60.0 to 69.9 in adult -  AMB Referral [facility] Care Management  Prediabetes -     POCT glycosylated hemoglobin (Hb A1C)     Return in about 3 months (around 06/02/2024).   Jerrlyn Morel, NP 03/02/2024

## 2024-03-13 ENCOUNTER — Telehealth: Payer: Self-pay | Admitting: *Deleted

## 2024-03-13 NOTE — Progress Notes (Unsigned)
 Care Guide Pharmacy Note  03/13/2024 Name: Krista Carroll MRN: [id number] DOB: April 26, 2001  Referred By: Oley Bascom RAMAN, NP Reason for referral: Complex Care Management (Initial outreach to schedule referral with PharmD)   Krista Carroll is a 23 y.o. year old female who is a primary care patient of Oley Bascom RAMAN, NP.  Krista Carroll was referred to the pharmacist for assistance related to: morbid obesity (HCC) [phone] (ICD-10-CM) - Class 3 severe obesity due to excess calories without serious comorbidity with body mass index (BMI) of 60.0 to 69.9 in adult  An unsuccessful telephone outreach was attempted today to contact the patient who was referred to the pharmacy team for assistance with medication assistance. Additional attempts will be made to contact the patient.  Harlene Satterfield  [facility]  [facility] Guide  Direct Dial: [phone]  Fax [phone]

## 2024-03-15 NOTE — Progress Notes (Signed)
 Care Guide Pharmacy Note  03/15/2024 Name: Leianna Barga MRN: [id number] DOB: 2000-12-20  Referred By: Oley Bascom RAMAN, NP Reason for referral: Complex Care Management (Initial outreach to schedule referral with PharmD)   Skylin Kennerson is a 23 y.o. year old female who is a primary care patient of Oley Bascom RAMAN, NP.  Jerrye Macadam was referred to the pharmacist for assistance related to: DMII  Successful contact was made with the patient to discuss pharmacy services including being ready for the pharmacist to call at least 5 minutes before the scheduled appointment time and to have medication bottles and any blood pressure readings ready for review. The patient agreed to meet with the pharmacist via telephone visit on (date/time). 8/13 at  130pm   Harlene Satterfield  [facility], [facility] Guide  Direct Dial: [phone]  Fax [phone]

## 2024-03-15 NOTE — Progress Notes (Signed)
 Care Guide Pharmacy Note  03/15/2024 Name: Krista Carroll MRN: [id number] DOB: 2001/07/25  Referred By: Oley Bascom RAMAN, NP Reason for referral: Complex Care Management (Initial outreach to schedule referral with PharmD)   Krista Carroll is a 23 y.o. year old female who is a primary care patient of Oley Bascom RAMAN, NP.  Krista Carroll was referred to the pharmacist for assistance related to:  morbid obesity (HCC) [phone] (ICD-10-CM) - Class 3 severe obesity due to excess calories without serious comorbidity with body mass index (BMI) of 60.0 to 69.9 in adult   A second unsuccessful telephone outreach was attempted today to contact the patient who was referred to the pharmacy team for assistance with medication management. Additional attempts will be made to contact the patient.  Krista Carroll  [facility], [facility] Guide  Direct Dial: [phone]  Fax [phone]

## 2024-04-25 ENCOUNTER — Other Ambulatory Visit (INDEPENDENT_AMBULATORY_CARE_PROVIDER_SITE_OTHER): Payer: Self-pay

## 2024-04-25 ENCOUNTER — Other Ambulatory Visit (HOSPITAL_COMMUNITY): Payer: Self-pay

## 2024-04-25 DIAGNOSIS — R7303 Prediabetes: Secondary | ICD-10-CM

## 2024-04-25 NOTE — Progress Notes (Signed)
 04/25/2024 Name: Krista Carroll MRN: [id number] DOB: 2001/06/22  Chief Complaint  Patient presents with   Weight Management Screening    Krista Carroll is a 23 y.o. year old female who presented for a telephone visit.   They were referred to the pharmacist by their PCP for assistance in managing weight management. PMH includes PCOS, prediabetes, BMI > 50.    Subjective: Patient was last seen by PCP, Bascom Borer, NP, on 03/02/24. At last visit, patient reported being interested in starting Wegovy  for weight loss. The medication has not been prescribed yet. She was referred to pharmacy for further management.   Today, patient reports doing well. She states she was confused why this appt was scheduled because she is aware that her insurance [facility]) does not cover GLP-1RA for indications other than T2DM. Reports that she was recently diagnosed with ADHD and binge eating disorder, and feels the medications she has started taking for these conditions (I.e. Vyvanse) have improved her metabolic health as she feels much more capable of regulating her eating habits.    Care Team: Primary Care Provider: Borer Bascom RAMAN, NP ; Next Scheduled Visit: 06/04/24  Medication Access/Adherence  Current Pharmacy:  [id number] - [place], [place] - [address] AT [facility] [address] [place] [address] Phone: [phone] Fax: [phone]  [id number] [place], [place] - [address] AT [facility] OF [facility] [address] [facility] [address] [place] [address] Phone: [phone] Fax: [phone]  [id number] - [place], [place] - [address] AT CORNER OF [facility] [address] [place] [address] Phone: [phone] Fax: [phone]   Obesity/Overweight, Complicated by prediabetes, HTN:  Current medications: none  Medications that influence weight: Vyvanse 60 mg  daily, bupropion 150 mg daily  Weight Management treatments previously prescribed: Mounjaro  (not covered)  Current medication access support: none. Not eligible for Medicaid.    Objective:  BP Readings from Last 3 Encounters:  03/02/24 137/67  11/17/23 (!) 144/89  07/27/23 (!) 156/88    Lab Results  Component Value Date   HGBA1C 5.8 (A) 03/02/2024   HGBA1C 6.1 (A) 11/17/2023   HGBA1C 6.1 (H) 09/03/2021       Latest Ref Rng & Units 11/17/2023   11:53 AM 04/06/2021    3:14 PM  BMP  Glucose 70 - 99 mg/dL 94  89   BUN 6 - 20 mg/dL 9  9   Creatinine 9.42 - 1.00 mg/dL 9.41  9.36   BUN/Creat Ratio 9 - 23 16  NOT APPLICABLE   Sodium 134 - 144 mmol/L 140  136   Potassium 3.5 - 5.2 mmol/L 4.4  4.3   Chloride 96 - 106 mmol/L 103  102   CO2 20 - 29 mmol/L 27  26   Calcium 8.7 - 10.2 mg/dL 9.5  9.5     Lab Results  Component Value Date   CHOL 163 11/17/2023   HDL 41 11/17/2023   LDLCALC 100 (H) 11/17/2023   TRIG 119 11/17/2023   CHOLHDL 4.0 11/17/2023    Medications Reviewed Today     Reviewed by Brinda Lorain SQUIBB, RPH (Pharmacist) on 04/25/24 at 1453  Med List Status: <None>   Medication Order Taking? Sig Documenting Provider Last Dose Status Informant  lisdexamfetamine (VYVANSE) 40 MG capsule [id number] Yes Take 50 mg by mouth daily. [provider]  Active     Discontinued 04/25/24 1452 (Patient Preference)  Patient not taking:   Discontinued 04/25/24 1453 (Patient Preference)     Discontinued 04/25/24 1453 (Cost of medication)   Vitamin D , Ergocalciferol , (DRISDOL ) 1.25 MG (50000 UNIT) CAPS capsule [id number]  TAKE 1 CAPSULE (50,000 UNITS TOTAL) BY MOUTH EVERY 7 (SEVEN) DAYS Oley Bascom RAMAN, NP  Active               Assessment/Plan:   Obesity/Overweight with prediabetes: - Currently unable to achieve goal weight loss of 5-10% through diet and lifestyle modifications alone, though she has noted improvement with initiation of treatment for ADHD and binge  eating disorder by an outside provider. Unfortunately, her insurance does not cover GLP-1RAs for indications other than weight loss, which the patient was aware of prior to today's telephone visit. Discussed diet and lifestyle interventions to help manage prediabetes. All questions were answered. Offered to reinvestigate coverage of GLP-1RA if patient has any change in insurance.  - Screened for Medicaid eligibility and patient reports being over income limit.    Follow Up Plan:  Pharmacist as needed PCP clinic visit 06/04/24   Lorain Baseman, PharmD [facility] [phone]

## 2024-06-04 ENCOUNTER — Ambulatory Visit: Payer: Self-pay | Admitting: Nurse Practitioner

## 2024-06-06 ENCOUNTER — Ambulatory Visit: Payer: Self-pay | Admitting: Nurse Practitioner

## 2024-06-20 ENCOUNTER — Other Ambulatory Visit (HOSPITAL_COMMUNITY)
Admission: RE | Admit: 2024-06-20 | Discharge: 2024-06-20 | Disposition: A | Discharge status: Home / Self Care | Source: Ambulatory Visit | Attending: Nurse Practitioner | Admitting: Nurse Practitioner

## 2024-06-20 ENCOUNTER — Encounter: Payer: Self-pay | Admitting: Nurse Practitioner

## 2024-06-20 ENCOUNTER — Ambulatory Visit (INDEPENDENT_AMBULATORY_CARE_PROVIDER_SITE_OTHER): Payer: Self-pay | Admitting: Nurse Practitioner

## 2024-06-20 VITALS — BP 146/67 | HR 74 | Resp 16 | Wt 370.4 lb

## 2024-06-20 DIAGNOSIS — Z124 Encounter for screening for malignant neoplasm of cervix: Secondary | ICD-10-CM | POA: Insufficient documentation

## 2024-06-20 DIAGNOSIS — Z1151 Encounter for screening for human papillomavirus (HPV): Secondary | ICD-10-CM | POA: Diagnosis not present

## 2024-06-20 DIAGNOSIS — Z113 Encounter for screening for infections with a predominantly sexual mode of transmission: Secondary | ICD-10-CM | POA: Insufficient documentation

## 2024-06-20 DIAGNOSIS — Z01419 Encounter for gynecological examination (general) (routine) without abnormal findings: Secondary | ICD-10-CM | POA: Diagnosis present

## 2024-06-20 NOTE — Progress Notes (Signed)
   Subjective   Patient ID: Krista Carroll, female    DOB: 03-11-2001, 23 y.o.   MRN: [id number]  Chief Complaint  Patient presents with   Gynecologic Exam    Referring provider: Oley Bascom RAMAN, NP  Krista Carroll is a 23 y.o. female with Past Medical History: No date: Medical history non-contributory   HPI  Patient presents today for a PAP and STD screen. Chaperone present, patient tolerated PAP well. Denies f/c/s, n/v/d, hemoptysis, PND, leg swelling Denies chest pain or edema     No Known Allergies  Immunization History  Administered Date(s) Administered   Influenza-Unspecified 10/21/2022   PFIZER(Purple Top)SARS-COV-2 Vaccination 01/18/2020   Tdap 12/02/2022    Tobacco History: Social History   Tobacco Use  Smoking Status Never  Smokeless Tobacco Never   Counseling given: Not Answered   Outpatient Encounter Medications as of 06/20/2024  Medication Sig   buPROPion (WELLBUTRIN XL) 150 MG 24 hr tablet Take 150 mg by mouth daily.   lisdexamfetamine (VYVANSE) 60 MG capsule Take 60 mg by mouth every morning.   Vitamin D , Ergocalciferol , (DRISDOL ) 1.25 MG (50000 UNIT) CAPS capsule TAKE 1 CAPSULE (50,000 UNITS TOTAL) BY MOUTH EVERY 7 (SEVEN) DAYS   No facility-administered encounter medications on file as of 06/20/2024.    Review of Systems  Review of Systems  Constitutional: Negative.   HENT: Negative.    Cardiovascular: Negative.   Gastrointestinal: Negative.   Allergic/Immunologic: Negative.   Neurological: Negative.   Psychiatric/Behavioral: Negative.       Objective:   BP (!) 146/67   Pulse 74   Resp 16   Wt (!) 370 lb 6.4 oz (168 kg)   SpO2 99%   BMI 64.08 kg/m   Wt Readings from Last 5 Encounters:  06/20/24 (!) 370 lb 6.4 oz (168 kg)  03/02/24 (!) 374 lb 9.6 oz (169.9 kg)  11/17/23 (!) 392 lb 6.4 oz (178 kg)  12/02/22 (!) 377 lb 12.8 oz (171.4 kg)  09/03/21 (!) 338 lb 9.6 oz (153.6 kg) (>99%, Z= 2.98)*   * Growth percentiles are based  on CDC (Girls, 2-20 Years) data.     Physical Exam Vitals and nursing note reviewed. Exam conducted with a chaperone present.  Constitutional:      General: She is not in acute distress.    Appearance: She is well-developed.  Cardiovascular:     Rate and Rhythm: Normal rate and regular rhythm.  Pulmonary:     Effort: Pulmonary effort is normal.     Breath sounds: Normal breath sounds.  Genitourinary:    General: Normal vulva.     Vagina: Normal.     Cervix: Friability present.     Uterus: Normal.   Neurological:     Mental Status: She is alert and oriented to person, place, and time.       Assessment & Plan:   Cervical cancer screening -     Cytology - PAP -     NuSwab Vaginitis Plus (VG+)     Return if symptoms worsen or fail to improve.   Bascom RAMAN Oley, NP 06/21/2024

## 2024-06-21 NOTE — Addendum Note (Signed)
 Addended by: OLEY BASCOM RAMAN on: 06/21/2024 08:31 AM   Modules accepted: Orders

## 2024-06-24 LAB — NUSWAB VAGINITIS PLUS (VG+)
Chlamydia trachomatis, NAA: NEGATIVE
Neisseria gonorrhoeae, NAA: NEGATIVE
Trich vag by NAA: NEGATIVE

## 2024-06-25 ENCOUNTER — Ambulatory Visit: Payer: Self-pay | Admitting: Nurse Practitioner

## 2024-06-25 MED ORDER — METRONIDAZOLE 500 MG PO TABS: 500.0000 mg | ORAL_TABLET | Freq: Two times a day (BID) | ORAL | 0 refills | Status: AC

## 2024-06-29 LAB — CYTOLOGY - PAP
Chlamydia: NEGATIVE
Comment: NEGATIVE
Comment: NEGATIVE
Comment: NEGATIVE
Comment: NEGATIVE
Comment: NORMAL
Diagnosis: NEGATIVE
HSV1: NEGATIVE
HSV2: NEGATIVE
High risk HPV: NEGATIVE
Neisseria Gonorrhea: NEGATIVE
Trichomonas: NEGATIVE

## 2024-09-23 ENCOUNTER — Other Ambulatory Visit: Payer: Self-pay | Admitting: Nurse Practitioner

## 2024-09-23 DIAGNOSIS — E559 Vitamin D deficiency, unspecified: Secondary | ICD-10-CM

## 2024-09-24 NOTE — Telephone Encounter (Signed)
 Vitamin D , Ergocalciferol , (DRISDOL ) 1.25 MG (50000 UNIT) CAPS capsule [Pharmacy Med Name: VITAMIN D2 1.25MG (50,000 UNIT)]     Requesting a 90 day supply

## 2024-10-08 ENCOUNTER — Other Ambulatory Visit: Payer: Self-pay

## 2024-10-08 ENCOUNTER — Inpatient Hospital Stay (HOSPITAL_COMMUNITY)
Admission: EM | Admit: 2024-10-08 | Discharge: 2024-10-08 | Disposition: A | Discharge status: Home / Self Care | Attending: Obstetrics and Gynecology | Admitting: Obstetrics and Gynecology

## 2024-10-08 DIAGNOSIS — D500 Iron deficiency anemia secondary to blood loss (chronic): Secondary | ICD-10-CM | POA: Insufficient documentation

## 2024-10-08 DIAGNOSIS — E282 Polycystic ovarian syndrome: Secondary | ICD-10-CM

## 2024-10-08 DIAGNOSIS — Z793 Long term (current) use of hormonal contraceptives: Secondary | ICD-10-CM | POA: Insufficient documentation

## 2024-10-08 DIAGNOSIS — R109 Unspecified abdominal pain: Secondary | ICD-10-CM | POA: Insufficient documentation

## 2024-10-08 DIAGNOSIS — N921 Excessive and frequent menstruation with irregular cycle: Secondary | ICD-10-CM | POA: Diagnosis not present

## 2024-10-08 DIAGNOSIS — Z79899 Other long term (current) drug therapy: Secondary | ICD-10-CM | POA: Insufficient documentation

## 2024-10-08 LAB — CBC WITH DIFFERENTIAL/PLATELET
Abs Immature Granulocytes: 0.05 10*3/uL (ref 0.00–0.07)
Basophils Absolute: 0.1 10*3/uL (ref 0.0–0.1)
Basophils Relative: 1 %
Eosinophils Absolute: 0.3 10*3/uL (ref 0.0–0.5)
Eosinophils Relative: 3 %
HCT: 36.3 % (ref 36.0–46.0)
Hemoglobin: 11.8 g/dL — ABNORMAL LOW (ref 12.0–15.0)
Immature Granulocytes: 0 %
Lymphocytes Relative: 33 %
Lymphs Abs: 3.7 10*3/uL (ref 0.7–4.0)
MCH: 25.5 pg — ABNORMAL LOW (ref 26.0–34.0)
MCHC: 32.5 g/dL (ref 30.0–36.0)
MCV: 78.4 fL — ABNORMAL LOW (ref 80.0–100.0)
Monocytes Absolute: 0.6 10*3/uL (ref 0.1–1.0)
Monocytes Relative: 5 %
Neutro Abs: 6.6 10*3/uL (ref 1.7–7.7)
Neutrophils Relative %: 58 %
Platelets: 445 10*3/uL — ABNORMAL HIGH (ref 150–400)
RBC: 4.63 MIL/uL (ref 3.87–5.11)
RDW: 15 % (ref 11.5–15.5)
Smear Review: NORMAL
WBC: 11.4 10*3/uL — ABNORMAL HIGH (ref 4.0–10.5)
nRBC: 0 % (ref 0.0–0.2)

## 2024-10-08 LAB — POCT PREGNANCY, URINE: Preg Test, Ur: NEGATIVE

## 2024-10-08 MED ORDER — FERROUS SULFATE 325 (65 FE) MG PO TABS: 325.0000 mg | ORAL_TABLET | ORAL | 3 refills | Status: DC

## 2024-10-08 MED ORDER — NORETHINDRONE ACETATE 5 MG PO TABS: 5.0000 mg | ORAL_TABLET | Freq: Every day | ORAL | 3 refills | Status: AC

## 2024-10-08 MED ORDER — NORETHINDRONE ACETATE 5 MG PO TABS: 5.0000 mg | ORAL_TABLET | Freq: Every day | ORAL | 3 refills | Status: DC

## 2024-10-08 MED ORDER — FERROUS SULFATE 325 (65 FE) MG PO TABS: 325.0000 mg | ORAL_TABLET | ORAL | 3 refills | Status: AC

## 2024-10-08 MED ORDER — NORETHINDRONE ACETATE 5 MG PO TABS
5.0000 mg | ORAL_TABLET | Freq: Once | ORAL | Status: DC
  Filled 2024-10-08: qty 1

## 2024-10-08 NOTE — MAU Note (Signed)
 Krista Carroll is a 24 y.o. at Unknown here in MAU reporting: she's having both abdominal pain and VB that began 2 days ago.  Reports has Hx PCOS and doesn't thinks she's pregnant and hasn't taken a UPT.  Reports VB has been heavy with quarter sized clots, changing pads every 2 hours but worst has passed.  LMP: November 22, 2025Onset of complaint: 2 days ago Pain score: 6 Vitals:   10/08/24 1535  BP: (!) 141/91  Pulse: 71  Resp: 18  Temp: 98.2 F (36.8 C)  SpO2: 100%     FHT: NA  Lab orders placed from triage: UPT

## 2024-10-08 NOTE — MAU Provider Note (Signed)
 [facility] Attending MAU Note  Chief Complaint: Vaginal Bleeding and Abdominal Pain    Event Date/Time   First Provider Initiated Contact with Patient 10/08/24 1634      SUBJECTIVE Krista Carroll is a 24 y.o. No obstetric history on file. at Unknown by LMP who presents with heavy vaginal bleeding. Reports h/o PCOS. Notes menses in 11/24. Bleeding started Saturday and became heavier over the last 24-48 hours with passage of clots. Denies N/V. fever, chills. Reports some lower abd. Pain.  Past Medical History:  Diagnosis Date   Medical history non-contributory    OB History  No obstetric history on file.   No past surgical history on file. Social History   Socioeconomic History   Marital status: Married    Spouse name: Not on file   Number of children: Not on file   Years of education: Not on file   Highest education level: Some college, no degree  Occupational History   Not on file  Tobacco Use   Smoking status: Never   Smokeless tobacco: Never  Vaping Use   Vaping status: Never Used  Substance and Sexual Activity   Alcohol use: Yes    Comment: occ   Drug use: Yes    Types: Marijuana   Sexual activity: Yes    Birth control/protection: None  Other Topics Concern   Not on file  Social History Narrative   Not on file   Social Drivers of Health   Tobacco Use: Low Risk (06/20/2024)   Patient History    Smoking Tobacco Use: Never    Smokeless Tobacco Use: Never    Passive Exposure: Not on file  Financial Resource Strain: Low Risk (03/01/2024)   Overall Financial Resource Strain (CARDIA)    Difficulty of Paying Living Expenses: Not very hard  Food Insecurity: Food Insecurity Present (03/01/2024)   Epic    Worried About Programme Researcher, Broadcasting/film/video in the Last Year: Never true    Ran Out of Food in the Last Year: Sometimes true  Transportation Needs: No Transportation Needs (03/01/2024)   Epic    Lack of Transportation (Medical): No    Lack of Transportation  (Non-Medical): No  Physical Activity: Insufficiently Active (03/01/2024)   Exercise Vital Sign    Days of Exercise per Week: 3 days    Minutes of Exercise per Session: 20 min  Stress: Stress Concern Present (03/01/2024)   [employer] of Occupational Health - Occupational Stress Questionnaire    Feeling of Stress: To some extent  Social Connections: Socially Integrated (03/01/2024)   Social Connection and Isolation Panel    Frequency of Communication with Friends and Family: Three times a week    Frequency of Social Gatherings with Friends and Family: More than three times a week    Attends Religious Services: More than 4 times per year    Active Member of [employer] or Organizations: Yes    Attends Engineer, Structural: More than 4 times per year    Marital Status: Married  Catering Manager Violence: Not At Risk (12/06/2022)   Received from [facility]   HITS    Over the last 12 months how often did your partner physically hurt you?: Never    Over the last 12 months how often did your partner insult you or talk down to you?: Never    Over the last 12 months how often did your partner threaten you with physical harm?: Never    Over the last 12 months  how often did your partner scream or curse at you?: Never  Depression (PHQ2-9): Low Risk (06/20/2024)   Depression (PHQ2-9)    PHQ-2 Score: 0  Alcohol Screen: Low Risk (03/01/2024)   Alcohol Screen    Last Alcohol Screening Score (AUDIT): 1  Housing: Unknown (03/01/2024)   Epic    Unable to Pay for Housing in the Last Year: No    Number of Times Moved in the Last Year: Not on file    Homeless in the Last Year: No  Utilities: Not At Risk (12/06/2022)   Received from [facility]    Threatened with loss of utilities: No  Health Literacy: Adequate Health Literacy (03/02/2024)   B1300 Health Literacy    Frequency of need for help with medical instructions: Never   Medications Ordered Prior to  Encounter[1] Allergies[2]  ROS: Pertinent items in HPI  OBJECTIVE BP (!) 141/91 (BP Location: Right Arm)   Pulse 71   Temp 98.2 F (36.8 C) (Oral)   Resp 18   Ht 5' 3.75 (1.619 m)   Wt (!) 160.2 kg   SpO2 100%   BMI 61.09 kg/m  CONSTITUTIONAL: Well-developed, well-nourished female in no acute distress.  HENT:  Normocephalic, atraumatic EYES: Conjunctivae and EOM are normal. P No scleral icterus.  NECK: Supple SKIN: Skin is warm and dry. No rash noted. Not diaphoretic. No erythema. No pallor. NEUROLGIC: Alert and oriented to person, place, and time.  CARDIOVASCULAR: Normal heart rate noted RESPIRATORY: Effort normal, no problems with respiration noted. ABDOMEN: Soft, normal bowel sounds, no distention noted.  No tenderness, rebound or guarding.  MUSCULOSKELETAL: Normal range of motion. No tenderness.  No cyanosis, clubbing, or edema.  2+ distal pulses.  LAB RESULTS Results for orders placed or performed during the hospital encounter of 10/08/24 (from the past 48 hours)  CBC with Differential/Platelet     Status: Abnormal (Preliminary result)   Collection Time: 10/08/24  3:58 PM  Result Value Ref Range   WBC 11.4 (H) 4.0 - 10.5 K/uL   RBC 4.63 3.87 - 5.11 MIL/uL   Hemoglobin 11.8 (L) 12.0 - 15.0 g/dL   HCT 63.6 63.9 - 53.9 %   MCV 78.4 (L) 80.0 - 100.0 fL   MCH 25.5 (L) 26.0 - 34.0 pg   MCHC 32.5 30.0 - 36.0 g/dL   RDW 84.9 88.4 - 84.4 %   Platelets 445 (H) 150 - 400 K/uL   nRBC 0.0 0.0 - 0.2 %    Comment: Performed at [facility], [address] [place], [place] [address]   Neutrophils Relative % PENDING %   Neutro Abs PENDING 1.7 - 7.7 K/uL   Band Neutrophils PENDING %   Lymphocytes Relative PENDING %   Lymphs Abs PENDING 0.7 - 4.0 K/uL   Monocytes Relative PENDING %   Monocytes Absolute PENDING 0.1 - 1.0 K/uL   Eosinophils Relative PENDING %   Eosinophils Absolute PENDING 0.0 - 0.5 K/uL   Basophils Relative PENDING %   Basophils Absolute PENDING 0.0 -  0.1 K/uL   WBC Morphology PENDING    RBC Morphology PENDING    Smear Review PENDING    Other PENDING %   nRBC PENDING 0 /100 WBC   Metamyelocytes Relative PENDING %   Myelocytes PENDING %   Promyelocytes Relative PENDING %   Blasts PENDING %   Immature Granulocytes PENDING %   Abs Immature Granulocytes PENDING 0.00 - 0.07 K/uL  Pregnancy, urine POC  Status: None   Collection Time: 10/08/24  3:59 PM  Result Value Ref Range   Preg Test, Ur NEGATIVE NEGATIVE    Comment:        THE SENSITIVITY OF THIS METHODOLOGY IS >20 mIU/mL.     IMAGING No results found.  MAU COURSE No anemia  ASSESSMENT 1. PCOS (polycystic ovarian syndrome)   2. Menorrhagia with irregular cycle   3. Iron deficiency anemia due to chronic blood loss     PLAN Discharge home Lengthy discussion had with pt. to explain PCOS, diagrams and verbal communication used to show impact on ovaries, endometrium, need for endometrial protection, impact on fertility, medical treatments to achieve necessary endpoints.  Should be on cyclical progestin vs. OC's if not trying to get pregnant.  Should be on Femara or clomid if trying to conceive. F/u in office Outpt. U/s ordered Aygestin  rx and iron rx given   Follow-up Information     [facility] Follow up in 3 week(s).   Specialty: Obstetrics and Gynecology Why: Hospital follow-up, they will call you with an appointment Contact information: [address] [place]  [address] [phone]               Allergies as of 10/08/2024   No Known Allergies      Medication List     TAKE these medications    buPROPion 150 MG 24 hr tablet Commonly known as: WELLBUTRIN XL Take 150 mg by mouth daily.   ferrous sulfate  325 (65 FE) MG tablet Take 1 tablet (325 mg total) by mouth every other day.   lisdexamfetamine 60 MG capsule Commonly known as: VYVANSE Take 50 mg by mouth every  morning.   norethindrone  5 MG tablet Commonly known as: AYGESTIN  Take 1 tablet (5 mg total) by mouth daily. Take for next 10 days - repeat monthly   Vitamin D  (Ergocalciferol ) 1.25 MG (50000 UNIT) Caps capsule Commonly known as: DRISDOL  TAKE 1 CAPSULE (50,000 UNITS TOTAL) BY MOUTH EVERY 7 (SEVEN) DAYS        Evaluation does not show pathology that would require ongoing emergent intervention or inpatient treatment. Patient is hemodynamically stable and mentating appropriately. Discussed findings and plan with patient, who agrees with care plan. All questions answered. Return precautions discussed and outpatient follow up recommendations given.  Fredirick Glenys RAMAN, MD 10/08/2024 4:55 PM     [1]  No current facility-administered medications on file prior to encounter.   Current Outpatient Medications on File Prior to Encounter  Medication Sig Dispense Refill   buPROPion (WELLBUTRIN XL) 150 MG 24 hr tablet Take 150 mg by mouth daily.     lisdexamfetamine (VYVANSE) 60 MG capsule Take 50 mg by mouth every morning.     Vitamin D , Ergocalciferol , (DRISDOL ) 1.25 MG (50000 UNIT) CAPS capsule TAKE 1 CAPSULE (50,000 UNITS TOTAL) BY MOUTH EVERY 7 (SEVEN) DAYS 45 capsule 1  [2] No Known Allergies

## 2024-10-11 ENCOUNTER — Ambulatory Visit (HOSPITAL_COMMUNITY)

## 2024-10-29 ENCOUNTER — Ambulatory Visit: Payer: Self-pay | Admitting: Family Medicine
